# Patient Record
Sex: Male | Born: 1985 | Race: White | Hispanic: No | Marital: Single | Smoking: Current some day smoker
Health system: Southern US, Community
[De-identification: ages and names within clinical notes are randomized; demographics above are authoritative.]

## PROBLEM LIST (undated history)

## (undated) DIAGNOSIS — J45909 Unspecified asthma, uncomplicated: Secondary | ICD-10-CM

## (undated) DIAGNOSIS — I10 Essential (primary) hypertension: Secondary | ICD-10-CM

## (undated) DIAGNOSIS — T7840XA Allergy, unspecified, initial encounter: Secondary | ICD-10-CM

## (undated) HISTORY — PX: UPPER GASTROINTESTINAL ENDOSCOPY: SHX188

## (undated) HISTORY — DX: Allergy, unspecified, initial encounter: T78.40XA

---

## 2010-08-22 ENCOUNTER — Emergency Department (HOSPITAL_BASED_OUTPATIENT_CLINIC_OR_DEPARTMENT_OTHER): Admission: EM | Admit: 2010-08-22 | Discharge: 2010-08-22 | Payer: Self-pay | Admitting: Emergency Medicine

## 2011-03-10 LAB — BASIC METABOLIC PANEL
BUN: 11 mg/dL (ref 6–23)
Chloride: 103 mEq/L (ref 96–112)
Creatinine, Ser: 1.3 mg/dL (ref 0.4–1.5)

## 2013-03-23 ENCOUNTER — Encounter (HOSPITAL_BASED_OUTPATIENT_CLINIC_OR_DEPARTMENT_OTHER): Payer: Self-pay

## 2013-03-23 ENCOUNTER — Emergency Department (HOSPITAL_BASED_OUTPATIENT_CLINIC_OR_DEPARTMENT_OTHER)
Admission: EM | Admit: 2013-03-23 | Discharge: 2013-03-23 | Disposition: A | Discharge status: Home / Self Care | Payer: BC Managed Care – PPO | Attending: Emergency Medicine | Admitting: Emergency Medicine

## 2013-03-23 DIAGNOSIS — H00019 Hordeolum externum unspecified eye, unspecified eyelid: Secondary | ICD-10-CM | POA: Insufficient documentation

## 2013-03-23 DIAGNOSIS — H5710 Ocular pain, unspecified eye: Secondary | ICD-10-CM | POA: Insufficient documentation

## 2013-03-23 DIAGNOSIS — H5712 Ocular pain, left eye: Secondary | ICD-10-CM

## 2013-03-23 DIAGNOSIS — H00016 Hordeolum externum left eye, unspecified eyelid: Secondary | ICD-10-CM

## 2013-03-23 DIAGNOSIS — Z23 Encounter for immunization: Secondary | ICD-10-CM | POA: Insufficient documentation

## 2013-03-23 MED ORDER — TETANUS-DIPHTH-ACELL PERTUSSIS 5-2.5-18.5 LF-MCG/0.5 IM SUSP
0.5000 mL | Freq: Once | INTRAMUSCULAR | Status: AC
  Administered 2013-03-23: 0.5 mL via INTRAMUSCULAR

## 2013-03-23 MED ORDER — TETRACAINE HCL 0.5 % OP SOLN
1.0000 [drp] | Freq: Once | OPHTHALMIC | Status: AC
  Administered 2013-03-23: 1 [drp] via OPHTHALMIC

## 2013-03-23 MED ORDER — ERYTHROMYCIN 5 MG/GM OP OINT: TOPICAL_OINTMENT | OPHTHALMIC | Status: DC

## 2013-03-23 MED ORDER — FLUORESCEIN SODIUM 1 MG OP STRP
ORAL_STRIP | OPHTHALMIC | Status: AC
  Filled 2013-03-23: qty 1

## 2013-03-23 MED ORDER — TETRACAINE HCL 0.5 % OP SOLN
OPHTHALMIC | Status: AC
  Administered 2013-03-23: 1 [drp] via OPHTHALMIC
  Filled 2013-03-23: qty 2

## 2013-03-23 MED ORDER — FLUORESCEIN SODIUM 1 MG OP STRP
1.0000 | ORAL_STRIP | Freq: Once | OPHTHALMIC | Status: AC
  Administered 2013-03-23: 13:00:00 via OPHTHALMIC

## 2013-03-23 MED ORDER — TETANUS-DIPHTH-ACELL PERTUSSIS 5-2.5-18.5 LF-MCG/0.5 IM SUSP
INTRAMUSCULAR | Status: AC
  Administered 2013-03-23: 0.5 mL via INTRAMUSCULAR
  Filled 2013-03-23: qty 0.5

## 2013-03-23 NOTE — ED Provider Notes (Signed)
History     CSN: [id number]  Arrival date & time 03/23/13  1203   First MD Initiated Contact with Patient 03/23/13 1245      Chief Complaint  Patient presents with  . Eye Problem    (Consider location/radiation/quality/duration/timing/severity/associated sxs/prior treatment) HPI Comments: Patient is a 27 y/o M with no significant PMHx c/o left eye discomfort x 5 days - patient reported that he got Roundup in his eye on Wednesday. Patient reported that he flushed the eye. Patient reported that pain is localized to the left eye upper eyelid, described as a soreness, pressure feeling mainly to the upper lid of the left eye - mild swelling to the left upper eyelid. Patient denied eye pain, mainly left upper eyelid pain. Patient reported using Visine with little relief. Patient reported that the eye just hurts and has a small "pimple" on the upper left eyelid near the medial aspect. Patient denied tearing, inflammation, discharge, changes to vision, blurriness, pain upon motion of eyes, injection, foreign body sensation, chest pain, shortness of breathe, difficulty breathing, abdominal pain, neck pain, abdominal pain, photophobia.    Patient is a 28 y.o. male presenting with eye problem. The history is provided by the patient. No language interpreter was used.  Eye Problem Location:  L eye Quality:  Burning and dull (Patient reported a feeling of pressure, soreness to left eye) Severity:  Moderate Onset quality:  Sudden Duration:  5 days Timing:  Constant Progression:  Worsening Chronicity:  New Context: chemical exposure   Context: not burn   Context comment:  Round Up in left eye Relieved by:  Nothing Exacerbated by: applying pressure ot left eye. Ineffective treatments:  Eye drops (saline drops) Associated symptoms: no blurred vision, no crusting, no decreased vision, no discharge, no double vision, no facial rash, no itching, no nausea, no numbness, no photophobia, no redness, no  swelling, no tearing, no tingling and no vomiting   Risk factors: no conjunctival hemorrhage, not exposed to pinkeye, no previous injury to eye and no recent URI     No past medical history on file.  No past surgical history on file.  No family history on file.  History  Substance Use Topics  . Smoking status: Never Smoker   . Smokeless tobacco: Never Used  . Alcohol Use: No      Review of Systems  Constitutional: Negative for fever and chills.  HENT: Negative for ear pain, sore throat, neck pain and tinnitus.   Eyes: Positive for pain. Negative for blurred vision, double vision, photophobia, discharge, redness, itching and visual disturbance.       Left upper eyelid pain  Respiratory: Negative for cough, chest tightness and shortness of breath.   Cardiovascular: Negative for chest pain.  Gastrointestinal: Negative for nausea, vomiting and abdominal pain.  Genitourinary:       Patient denied urinary symptoms  Musculoskeletal: Negative for back pain.  Neurological: Negative for tingling and numbness.  All other systems reviewed and are negative.    Allergies  Review of patient's allergies indicates no known allergies.  Home Medications   Current Outpatient Rx  Name  Route  Sig  Dispense  Refill  . erythromycin ophthalmic ointment      Place a 1 cm ribbon of ointment into the lower eyelid of left eye QID for 5 days.   1 g   0     BP 143/86  Pulse 71  Temp(Src) 97.3 F (36.3 C) (Oral)  Resp 20  Ht  6' (1.829 m)  Wt 190 lb (86.183 kg)  BMI 25.76 kg/m2  SpO2 100%  Physical Exam  Nursing note and vitals reviewed. Constitutional: He is oriented to person, place, and time. He appears well-developed and well-nourished.  HENT:  Head: Normocephalic and atraumatic.  Eye exam: No discharge, tearing. Mild swelling to left upper eyelid. No direct photophobia, no consensual photophobia. Small 1-2 mm pustule located on medial aspect of left upper eyelid.  Pain upon  palpation to left upper eyelid. No erythema to conjunctiva or palpebral conjunctiva. EOMs intact and no pain. Everted left eyelid no foreign body noted - swabbed upper eyelid with no foreign body noted.  Slit Lamp: No corneal abrasion noted. No dendritic lesion. Woods Lamp: No corneal abrasion noted. No foreign body noted. Negative Seidel's sign.  Eyes: Conjunctivae and EOM are normal. Pupils are equal, round, and reactive to light. Right eye exhibits no discharge. Left eye exhibits no discharge.  Neck: Normal range of motion. Neck supple. No tracheal deviation present.  Cardiovascular: Normal rate, regular rhythm, normal heart sounds and intact distal pulses.  Exam reveals no friction rub.   No murmur heard. Pulmonary/Chest: Effort normal and breath sounds normal. No respiratory distress. He has no wheezes. He has no rales.  Lymphadenopathy:    He has no cervical adenopathy.  Neurological: He is alert and oriented to person, place, and time. No cranial nerve deficit. He exhibits normal muscle tone. Coordination normal.  Skin: Skin is warm and dry. No rash noted. He is not diaphoretic. No erythema.  Psychiatric: He has a normal mood and affect. His behavior is normal. Thought content normal.    ED Course  Procedures (including critical care time)  Labs Reviewed - No data to display No results found.   1. Hordeolum, left   2. Eye pain, left       MDM  DDx: Chalazion Stye  I personally evaluated and examined the patient. Tetracaine given, slit lamp performed - no sign of corneal abrasion, no foreign body noted, dendritic lesion noted. Wood's lamp performed with negative Seidel's sign, no corneal abrasion noted. No sign of uveitis or irisitis. No direct photophobia, no consensual photophobia. Patient afebrile, normotensive, non-tachycardic, alert and oriented, happy affect. Discharged patient - suspected horduleum. Discharged patient with erythromycin antibiotic ointment - discussed with  patient on how to apply medication. Discussed with patient to use warm compresses to left eye and to not rub the eye. Discussed with patient to follow-up with ophthalmologist regarding condition. Discussed with patient that if symptoms are to worsen to please report back to the ED.  Patient agreed to plan of care, understood, all questions answered.         Raymon Mutton, PA-C 03/23/13 1631 Visual acuity performed - normal. TDaP booster vaccine given.   Raymon Mutton, PA-C 03/23/13 1637

## 2013-03-23 NOTE — ED Notes (Signed)
Pt states that a few days ago he got Round Up his L eye.  Pt presents with L eyelid swelling, redness.  No visual decline.  Perrla

## 2013-03-25 NOTE — ED Provider Notes (Signed)
Medical screening examination/treatment/procedure(s) were conducted as a shared visit with non-physician practitioner(s) and myself.  I personally evaluated the patient during the encounter  Pt well appearing, no distress, no signs of acute chemical injury to eye Stable for d/c home  Joya Gaskins, MD 03/25/13 [phone]

## 2013-12-02 ENCOUNTER — Emergency Department (HOSPITAL_BASED_OUTPATIENT_CLINIC_OR_DEPARTMENT_OTHER)
Admission: EM | Admit: 2013-12-02 | Discharge: 2013-12-03 | Disposition: A | Discharge status: Home / Self Care | Payer: BC Managed Care – PPO | Attending: Emergency Medicine | Admitting: Emergency Medicine

## 2013-12-02 ENCOUNTER — Encounter (HOSPITAL_BASED_OUTPATIENT_CLINIC_OR_DEPARTMENT_OTHER): Payer: Self-pay | Admitting: Emergency Medicine

## 2013-12-02 DIAGNOSIS — I10 Essential (primary) hypertension: Secondary | ICD-10-CM | POA: Insufficient documentation

## 2013-12-02 DIAGNOSIS — Y939 Activity, unspecified: Secondary | ICD-10-CM | POA: Insufficient documentation

## 2013-12-02 DIAGNOSIS — J45909 Unspecified asthma, uncomplicated: Secondary | ICD-10-CM | POA: Insufficient documentation

## 2013-12-02 DIAGNOSIS — Y929 Unspecified place or not applicable: Secondary | ICD-10-CM | POA: Insufficient documentation

## 2013-12-02 DIAGNOSIS — IMO0002 Reserved for concepts with insufficient information to code with codable children: Secondary | ICD-10-CM | POA: Insufficient documentation

## 2013-12-02 DIAGNOSIS — T182XXA Foreign body in stomach, initial encounter: Secondary | ICD-10-CM | POA: Insufficient documentation

## 2013-12-02 DIAGNOSIS — T18108A Unspecified foreign body in esophagus causing other injury, initial encounter: Secondary | ICD-10-CM

## 2013-12-02 HISTORY — DX: Essential (primary) hypertension: I10

## 2013-12-02 HISTORY — DX: Unspecified asthma, uncomplicated: J45.909

## 2013-12-02 MED ORDER — ONDANSETRON HCL 4 MG/2ML IJ SOLN
4.0000 mg | Freq: Once | INTRAMUSCULAR | Status: AC
  Administered 2013-12-03: 4 mg via INTRAVENOUS
  Filled 2013-12-02: qty 2

## 2013-12-02 MED ORDER — GLUCAGON HCL (RDNA) 1 MG IJ SOLR
1.0000 mg | Freq: Once | INTRAMUSCULAR | Status: AC
  Administered 2013-12-03: 1 mg via INTRAVENOUS
  Filled 2013-12-02: qty 1

## 2013-12-02 MED ORDER — DIAZEPAM 5 MG/ML IJ SOLN
2.5000 mg | Freq: Once | INTRAMUSCULAR | Status: AC
  Administered 2013-12-03: 2.5 mg via INTRAVENOUS
  Filled 2013-12-02: qty 2

## 2013-12-02 NOTE — ED Provider Notes (Signed)
CSN: [id number]     Arrival date & time 12/02/13  2333 History   First MD Initiated Contact with Patient 12/02/13 2342     This chart was scribed for Rhyland Hinderliter Smitty Cords, MD by Manuela Schwartz, ED scribe. This patient was seen in room MH04/MH04 and the patient's care was started at 2342.  Chief Complaint  Patient presents with  . Foreign Body   Patient is a 27 y.o. male presenting with foreign body. The history is provided by the patient. No language interpreter was used.  Foreign Body Location:  Swallowed Suspected object:  Food Pain quality:  Dull Duration:  3 hours Timing:  Constant Progression:  Unchanged Chronicity:  Recurrent Worsened by:  Nothing tried Ineffective treatments:  Flushing with water Associated symptoms: trouble swallowing   Associated symptoms: no abdominal pain, no congestion, no cough, no difficulty breathing, no nausea, no rhinorrhea and no vomiting   Risk factors: prior similar events   Risk factors: no developmental delay and no prior surgery to area    HPI Comments: Tony Mata is a 27 y.o. male who presents to the Emergency Department complaining of foreign body sensation in his esophagus, onset 3 hours ago after eating deer meat and having it stuck, unsure of what size the piece of meat was but feels it is stuck in his esophagus. He reports previous similar episodes, he has not seen anyone for this problem. He reports spitting up secretions and unable to swallow spit. He states this problem has occurred many times for him, not more than once a month though.  Past Medical History  Diagnosis Date  . Hypertension    History reviewed. No pertinent past surgical history. History reviewed. No pertinent family history. History  Substance Use Topics  . Smoking status: Never Smoker   . Smokeless tobacco: Never Used  . Alcohol Use: No    Review of Systems  Constitutional: Negative for fever and chills.  HENT: Positive for trouble swallowing. Negative  for congestion and rhinorrhea.   Respiratory: Negative for cough and shortness of breath.   Cardiovascular: Negative for chest pain.  Gastrointestinal: Negative for nausea, vomiting, abdominal pain and diarrhea.  Musculoskeletal: Negative for back pain.  Skin: Negative for color change and rash.  Neurological: Negative for syncope.  All other systems reviewed and are negative.   A complete 10 system review of systems was obtained and all systems are negative except as noted in the HPI and PMH.   Allergies  Review of patient's allergies indicates no known allergies.  Home Medications   Current Outpatient Rx  Name  Route  Sig  Dispense  Refill  . hydrochlorothiazide (HYDRODIURIL) 25 MG tablet   Oral   Take 25 mg by mouth daily.         Marland Kitchen erythromycin ophthalmic ointment      Place a 1 cm ribbon of ointment into the lower eyelid of left eye QID for 5 days.   1 g   0    Triage Vitals: BP 132/82  Pulse 84  Temp(Src) 97.8 F (36.6 C) (Oral)  Resp 18  Ht 6' (1.829 m)  Wt 200 lb (90.719 kg)  BMI 27.12 kg/m2  SpO2 100% Physical Exam  Nursing note and vitals reviewed. Constitutional: He is oriented to person, place, and time. He appears well-developed and well-nourished. No distress.  HENT:  Head: Normocephalic and atraumatic.  Mouth/Throat: Oropharynx is clear and moist.  Nothing visible in posterior oropharynx  Eyes: Conjunctivae are normal.  Pupils are equal, round, and reactive to light. Right eye exhibits no discharge. Left eye exhibits no discharge.  Neck: Normal range of motion. No tracheal deviation present.  Cardiovascular: Normal rate, regular rhythm and normal heart sounds.   No murmur heard. Pulmonary/Chest: Effort normal and breath sounds normal. No respiratory distress. He has no wheezes. He has no rales.  Abdominal: Soft. Bowel sounds are normal. He exhibits no distension. There is no tenderness.  Musculoskeletal: Normal range of motion. He exhibits no edema.   Neurological: He is alert and oriented to person, place, and time.  Skin: Skin is warm and dry.  Psychiatric: He has a normal mood and affect. Thought content normal.    ED Course  Procedures (including critical care time) DIAGNOSTIC STUDIES: Oxygen Saturation is 100% on room air, normal by my interpretation.    COORDINATION OF CARE: At 1145 PM Discussed treatment plan with patient which includes zofran, glucagon. Patient agrees.   Labs Review Labs Reviewed - No data to display Imaging Review No results found.  EKG Interpretation   None       MDM  No diagnosis found. Have attempted glucagon follow by liquids, valium and SL NTG, still unable to tolerate secretions.    Case d/w Dr. Christella Hartigan send to [facility] ED page Dr. Christella Hartigan on arrival to decide ED vs Endo examination.   I personally performed the services described in this documentation, which was scribed in my presence. The recorded information has been reviewed and is accurate.       Jasmine Awe, MD 12/03/13 [phone]

## 2013-12-02 NOTE — ED Notes (Signed)
MD at bedside. 

## 2013-12-02 NOTE — ED Notes (Signed)
Pt c/o piece of meat stuck in throat, no diff speaking or breathing

## 2013-12-03 ENCOUNTER — Telehealth: Payer: Self-pay

## 2013-12-03 ENCOUNTER — Encounter (HOSPITAL_COMMUNITY): Payer: Self-pay | Admitting: *Deleted

## 2013-12-03 ENCOUNTER — Encounter (HOSPITAL_COMMUNITY)
Admission: EM | Disposition: A | Discharge status: Home / Self Care | Payer: Self-pay | Source: Home / Self Care | Attending: Emergency Medicine

## 2013-12-03 DIAGNOSIS — T18108A Unspecified foreign body in esophagus causing other injury, initial encounter: Secondary | ICD-10-CM

## 2013-12-03 HISTORY — PX: ESOPHAGOGASTRODUODENOSCOPY: SHX5428

## 2013-12-03 SURGERY — EGD (ESOPHAGOGASTRODUODENOSCOPY)
Anesthesia: Moderate Sedation

## 2013-12-03 MED ORDER — DIPHENHYDRAMINE HCL 50 MG/ML IJ SOLN
INTRAMUSCULAR | Status: AC
  Filled 2013-12-03: qty 1

## 2013-12-03 MED ORDER — NITROGLYCERIN 0.3 MG SL SUBL
0.2000 mg | SUBLINGUAL_TABLET | Freq: Once | SUBLINGUAL | Status: AC
  Administered 2013-12-03: 0.2 mg via SUBLINGUAL
  Filled 2013-12-03: qty 100

## 2013-12-03 MED ORDER — MIDAZOLAM HCL 10 MG/2ML IJ SOLN
INTRAMUSCULAR | Status: DC | PRN
  Administered 2013-12-03 (×4): 2.5 mg via INTRAVENOUS
  Administered 2013-12-03: 2 mg via INTRAVENOUS

## 2013-12-03 MED ORDER — MIDAZOLAM HCL 10 MG/2ML IJ SOLN
INTRAMUSCULAR | Status: AC
  Filled 2013-12-03: qty 4

## 2013-12-03 MED ORDER — FENTANYL CITRATE 0.05 MG/ML IJ SOLN
INTRAMUSCULAR | Status: DC | PRN
  Administered 2013-12-03 (×4): 25 ug via INTRAVENOUS

## 2013-12-03 MED ORDER — NITROGLYCERIN 0.4 MG SL SUBL
SUBLINGUAL_TABLET | SUBLINGUAL | Status: AC
  Filled 2013-12-03: qty 25

## 2013-12-03 MED ORDER — BUTAMBEN-TETRACAINE-BENZOCAINE 2-2-14 % EX AERO
INHALATION_SPRAY | CUTANEOUS | Status: DC | PRN
  Administered 2013-12-03: 1 via TOPICAL

## 2013-12-03 MED ORDER — FENTANYL CITRATE 0.05 MG/ML IJ SOLN
INTRAMUSCULAR | Status: AC
  Filled 2013-12-03: qty 4

## 2013-12-03 MED ORDER — DIPHENHYDRAMINE HCL 50 MG/ML IJ SOLN
INTRAMUSCULAR | Status: DC | PRN
  Administered 2013-12-03: 25 mg via INTRAVENOUS

## 2013-12-03 NOTE — ED Notes (Signed)
Bed: WA21 Expected date: 12/03/13 Expected time: 1:36 AM Means of arrival: Ambulance Comments: Transfer from med center

## 2013-12-03 NOTE — Telephone Encounter (Signed)
There was a patient chart error related to EPIC only. The endopro procedure report has all of the correct patients information. He was 27, same name otherwise. [facility] endo told me about the problem this morning and they were working to correct the EPIC portion. I didn't think to tell you about the error, I apologize. Please contact the correct Tony Mata (born [birth year] 27 years old). Also let [name] know that we tracked down the error that led to his being contacted and are correcting it. Thanks        Donata Duff, CMA at 12/03/2013 1:53 PM     Status: Signed        Dr Christella Hartigan I called this pt and he says he was never in the ER, are you sure this is the correct pt?        Donata Duff, CMA at 12/03/2013 7:59 AM     Status: Signed        Message copied by Donata Duff on Wed Dec 03, 2013 7:59 AM  ------  Message from: Rob Bunting P  Created: Wed Dec 03, 2013 5:33 AM  Was in ER early this AM with impacted deer meat in esophagus.  He needs rov with me or extender in 2-3 weeks to discuss his response to bid ppi, schedule repeat egd (5-6 weeks from now). Thanks

## 2013-12-03 NOTE — ED Notes (Signed)
Pt left with IV in place per EDP orders.  No distress noted.  Pt ambulatory.  pts family given directions to [employer].

## 2013-12-03 NOTE — Op Note (Signed)
[facility] [address] [place], [address]   ENDOSCOPY PROCEDURE REPORT  PATIENT: Tony Mata, Tony Mata  MR#: [id number] BIRTHDATE: Aug 01, 1986 , 27  yrs. old GENDER: Male ENDOSCOPIST: Rachael Fee, MD PROCEDURE DATE:  12/03/2013 PROCEDURE:  EGD w/ fb removal ASA CLASS:     Class I INDICATIONS:  foreign body in esophagus, acute dysphagia (deer meat). MEDICATIONS: Fentanyl 100 mcg IV, Benadryl 25 mg IV, and Versed 12 mg IV TOPICAL ANESTHETIC: none  DESCRIPTION OF PROCEDURE: After the risks benefits and alternatives of the procedure were thoroughly explained, informed consent was obtained.  The Pentax Gastroscope [id number] endoscope was introduced through the mouth and advanced to the stomach body. Without limitations.  The instrument was slowly withdrawn as the mucosa was fully examined.    There was a bolus of impacted fibrous dark meat in distal esophagus. The surrounding esophageal mucosa was very edematous, inflamed but there were no clear strictures.  The meat bolus was partially removed with Lucina Mellow Net and then it was able to be gently pushed into the stomach.  The examined UGI tract was otherwise normal. Retroflexed views revealed no abnormalities.     The scope was then withdrawn from the patient and the procedure completed.  COMPLICATIONS: There were no complications. ENDOSCOPIC IMPRESSION: There was a bolus of impacted fibrous dark meat in distal esophagus. The surrounding esophageal mucosa was very edematous, inflamed but there were no clear strictures.  The meat bolus was partially removed with Lucina Mellow Net and then it was able to be gently pushed into the stomach.  The examined UGI tract was otherwise normal.  RECOMMENDATIONS: You need to start taking omeprazole (OTC) 20mg  pill.  One pill twice daily (20-30 min prior to BF and dinner meals).  You need to chew your food well, eat slowly and take small bites.  My office will contact you about  office visit in 1-2 weeks to discuss repeating this endoscopy, performing biopsies to check for a condition called eosinophilic esophagitis as an outpatient.  You should continue the antiacid medicine twice daily until that next upper endoscopy.   eSigned:  Rachael Fee, MD 12/03/2013 5:28 AM

## 2013-12-03 NOTE — ED Provider Notes (Signed)
27 year old adult male presenting to [facility] after fluid bolus got stuck, while eating venison.  Has a history of same. Apparently tonight, unable to swallow or pass liquids. He was treated at [facility] with medications and no improvement of symptoms. He was accepted in transfer to the emergency department by GI Dr. Christella Hartigan who presented here to [facility] long bedside, performed bedside endoscopy and discharge patient home.  Post procedure patient is resting comfortably, feeling better.  Vital signs within normal limits.   Discharge instructions and paperwork provided to patient by Dr. Christella Hartigan  5:59 AM patient has no complaints. Speech is clear, no respiratory distress, heart rate 80s. Stable for discharge home  Sunnie Nielsen, MD 12/03/13 0600

## 2013-12-04 ENCOUNTER — Encounter (HOSPITAL_COMMUNITY): Payer: Self-pay | Admitting: Gastroenterology

## 2013-12-04 NOTE — Telephone Encounter (Signed)
Phone number listed has been disconnected and no other contact info is available.  Letter mailed

## 2013-12-12 ENCOUNTER — Encounter: Payer: Self-pay | Admitting: Gastroenterology

## 2014-01-20 ENCOUNTER — Encounter: Payer: Self-pay | Admitting: Gastroenterology

## 2014-01-20 ENCOUNTER — Ambulatory Visit (INDEPENDENT_AMBULATORY_CARE_PROVIDER_SITE_OTHER): Payer: BC Managed Care – PPO | Admitting: Gastroenterology

## 2014-01-20 VITALS — BP 118/80 | HR 78 | Ht 72.0 in | Wt 193.0 lb

## 2014-01-20 DIAGNOSIS — R1314 Dysphagia, pharyngoesophageal phase: Secondary | ICD-10-CM

## 2014-01-20 NOTE — Patient Instructions (Signed)
You should change the way you are taking your antiacid medicine (prilosec) so that you are taking it 20-30 minutes prior to a decent dinner meal as that is the way the pill is designed to work most effectively. You will be set up for an upper endoscopy in 6 weeks ([facility]) to check for eosinophilic esophagitis.  Stay on prilosec until the upper endoscopy.

## 2014-01-20 NOTE — Progress Notes (Signed)
Review of pertinent gastrointestinal problems: 1. Esophageal food impaction; 11/2013.  Deer meat.  EGD Christella HartiganJacobs with FB removal.  Recommended twice daily PPI and office visit to discuss symptoms, consider repeat EGD to biopsy for EE.   HPI: This is a  very pleasant 28 year old man whom I last saw at the time of esophageal food impaction about 6 weeks ago.  No trouble since deer meat impaction.  Chewing very well.  Was having 2 years of meat associated dysphagia.  Has pyrosis, especially if he does smokeless tobacco  Bought PPI but not really taking it regularly.  Intentionally lost 10 pounds.   Past Medical History  Diagnosis Date  . Hypertension   . Asthma     Past Surgical History  Procedure Laterality Date  . Esophagogastroduodenoscopy N/A 12/03/2013    Procedure: ESOPHAGOGASTRODUODENOSCOPY (EGD);  Surgeon: Rachael Feeaniel P Zayda Angell, MD;  [facility];  Service: Endoscopy;  Laterality: N/A;    Current Outpatient Prescriptions  Medication Sig Dispense Refill  . hydrochlorothiazide (HYDRODIURIL) 25 MG tablet Take 25 mg by mouth daily.      . montelukast (SINGULAIR) 10 MG tablet Take 10 mg by mouth at bedtime.       No current facility-administered medications for this visit.    Allergies as of 01/20/2014  . (No Known Allergies)    History reviewed. No pertinent family history.  History   Social History  . Marital Status: Single    Spouse Name: N/A    Number of Children: N/A  . Years of Education: N/A   Occupational History  . Not on file.   Social History Main Topics  . Smoking status: Never Smoker   . Smokeless tobacco: Never Used  . Alcohol Use: No     Comment: 1-2times per week  . Drug Use: No  . Sexual Activity: Not on file   Other Topics Concern  . Not on file   Social History Narrative  . No narrative on file      Physical Exam: Ht 6' (1.829 m)  Wt 193 lb (87.544 kg)  BMI 26.17 kg/m2 Constitutional: generally  well-appearing Psychiatric: alert and oriented x3 Abdomen: soft, nontender, nondistended, no obvious ascites, no peritoneal signs, normal bowel sounds     Assessment and plan: 28 y.o. male with chronic intermittent dysphagia to solids, recent esophageal food impaction  His dysphagia is either GERD, edema related or perhaps he has eosinophilic esophagitis. He has not been taking proton pump inhibitor regularly since the time of his food impaction and I recommended he do so. 1 proton pump inhibitor once daily. I will likely repeat EGD about 6 weeks time check for underlying strictures, I would proceed with biopsies for eosinophilic esophagitis as well. He knows for now to continue chew his food well and eating slowly.

## 2014-03-03 ENCOUNTER — Ambulatory Visit (AMBULATORY_SURGERY_CENTER): Payer: BC Managed Care – PPO | Admitting: Gastroenterology

## 2014-03-03 ENCOUNTER — Encounter: Payer: Self-pay | Admitting: Gastroenterology

## 2014-03-03 VITALS — BP 133/61 | HR 70 | Temp 97.0°F | Resp 23 | Ht 72.0 in | Wt 193.0 lb

## 2014-03-03 DIAGNOSIS — K200 Eosinophilic esophagitis: Secondary | ICD-10-CM

## 2014-03-03 DIAGNOSIS — R933 Abnormal findings on diagnostic imaging of other parts of digestive tract: Secondary | ICD-10-CM

## 2014-03-03 DIAGNOSIS — K222 Esophageal obstruction: Secondary | ICD-10-CM

## 2014-03-03 DIAGNOSIS — R1314 Dysphagia, pharyngoesophageal phase: Secondary | ICD-10-CM

## 2014-03-03 MED ORDER — SODIUM CHLORIDE 0.9 % IV SOLN: 500.0000 mL | INTRAVENOUS | Status: DC

## 2014-03-03 NOTE — Progress Notes (Signed)
Called to room to assist during endoscopic procedure.  Patient ID and intended procedure confirmed with present staff. Received instructions for my participation in the procedure from the performing physician.  

## 2014-03-03 NOTE — Patient Instructions (Signed)
YOU HAD AN ENDOSCOPIC PROCEDURE TODAY AT THE [facility]: Refer to the procedure report that was given to you for any specific questions about what was found during the examination.  If the procedure report does not answer your questions, please call your gastroenterologist to clarify.  If you requested that your care partner not be given the details of your procedure findings, then the procedure report has been included in a sealed envelope for you to review at your convenience later.  YOU SHOULD EXPECT: Some feelings of bloating in the abdomen. Passage of more gas than usual.  Walking can help get rid of the air that was put into your GI tract during the procedure and reduce the bloating. If you had a lower endoscopy (such as a colonoscopy or flexible sigmoidoscopy) you may notice spotting of blood in your stool or on the toilet paper. If you underwent a bowel prep for your procedure, then you may not have a normal bowel movement for a few days.  DIET: NOTHING TO EAT OR DRINK UNTIL 9:45. 9:45 UNTIL 10:45 ONLY CLEAR LIQUIDS. AFTER 10:45 ONLY SOFT FOODS. RESUME YOUR DIET IN AM.  ACTIVITY: Your care partner should take you home directly after the procedure.  You should plan to take it easy, moving slowly for the rest of the day.  You can resume normal activity the day after the procedure however you should NOT DRIVE or use heavy machinery for 24 hours (because of the sedation medicines used during the test).    SYMPTOMS TO REPORT IMMEDIATELY: A gastroenterologist can be reached at any hour.  During normal business hours, 8:30 AM to 5:00 PM Monday through Friday, call [phone].  After hours and on weekends, please call the GI answering service at [phone] who will take a message and have the physician on call contact you.   Following upper endoscopy (EGD)  Vomiting of blood or coffee ground material  New chest pain or pain under the shoulder blades  Painful or persistently  difficult swallowing  New shortness of breath  Fever of 100F or higher  Black, tarry-looking stools  FOLLOW UP: If any biopsies were taken you will be contacted by phone or by letter within the next 1-3 weeks.  Call your gastroenterologist if you have not heard about the biopsies in 3 weeks.  Our staff will call the home number listed on your records the next business day following your procedure to check on you and address any questions or concerns that you may have at that time regarding the information given to you following your procedure. This is a courtesy call and so if there is no answer at the home number and we have not heard from you through the emergency physician on call, we will assume that you have returned to your regular daily activities without incident.  SIGNATURES/CONFIDENTIALITY: You and/or your care partner have signed paperwork which will be entered into your electronic medical record.  These signatures attest to the fact that that the information above on your After Visit Summary has been reviewed and is understood.  Full responsibility of the confidentiality of this discharge information lies with you and/or your care-partner.

## 2014-03-03 NOTE — Progress Notes (Signed)
When I entered the procedure room, Judithann Saugerarlene Massey, endo tech was assisting Dr. Christella HartiganJacobs with balloon dilatation of the esophagus.  Per Dr. Christella HartiganJacobs he visually confirmed the set up and setting of the balloon by Judithann Saugerarlene Massey, endo tech.  She aided Dr. Christella HartiganJacobs with the dilation under his supervision.  maw

## 2014-03-03 NOTE — Progress Notes (Signed)
ON ADMISSION PATIENT REQUESTING TO USE PROAIR INHALER, DIS CUSSED WITH JOHN NULTY CRNA. PATIENT USING 2 PUFFS.

## 2014-03-03 NOTE — Op Note (Signed)
[facility] [address] [place], [id number]   ENDOSCOPY PROCEDURE REPORT  PATIENT: Tony Mata, Tony B.  MR#: [id number] BIRTHDATE: Aug 05, 1986 , 27  yrs. old GENDER: Male ENDOSCOPIST: Rachael Feeaniel P Reisa Coppola, MD PROCEDURE DATE:  03/03/2014 PROCEDURE:  EGD w/ biopsy , EGD with balloon dilation ASA CLASS:     Class I INDICATIONS:  Esophageal food impaction; 11/2013.  Deer meat.  EGD Christella HartiganJacobs with FB removal.  Recommended twice daily PPI; here for repeat EGD to biopsy for EE. Persistent dysphagia. MEDICATIONS: MAC sedation, administered by CRNA and propofol (Diprivan) 300mg  IV TOPICAL ANESTHETIC: none  DESCRIPTION OF PROCEDURE: After the risks benefits and alternatives of the procedure were thoroughly explained, informed consent was obtained.  The LB [id number] endoscope was introduced through the mouth and advanced to the second portion of the duodenum. Without limitations.  The instrument was slowly withdrawn as the mucosa was fully examined.   There were linear furrows throughout the esophagus.  The GE junction was granular, and somewhat stenotic (lumen 14mm). This was not typical appearing for peptic stricture or Schatzki's ring.  Was not neoplastic appearing.  The GE junction was dilated with CRE TTS ballon, dilated up to 16.295mm.  There was a 1.5cm long, linear, superficial tear at the GE junction and self limited oozing following dilation.  The distal and proximal esophagus were then dilated and put into separate jars.  The examination was otherwise normal.  Retroflexed views revealed no abnormalities.     The scope was then withdrawn from the patient and the procedure completed. COMPLICATIONS: There were no complications.  ENDOSCOPIC IMPRESSION: See above. Findings were suspicious for Eosinophilic Esophagitis.  RECOMMENDATIONS: Await pathology results. Continue once daily prilosec. Liquid diet for 24 hours.   eSigned:  Rachael Feeaniel P Jaymin Waln, MD 03/03/2014  8:51 AM

## 2014-03-03 NOTE — Progress Notes (Signed)
A/ox3 pleased with MAC, report to Karen RN 

## 2014-03-04 ENCOUNTER — Telehealth: Payer: Self-pay

## 2014-03-04 NOTE — Telephone Encounter (Signed)
  Follow up Call-  Call back number 03/03/2014  Post procedure Call Back phone  # [phone]  Permission to leave phone message Yes     Patient questions:  Do you have a fever, pain , or abdominal swelling? no Pain Score  0 *  Have you tolerated food without any problems? yes  Have you been able to return to your normal activities? yes  Do you have any questions about your discharge instructions: Diet   no Medications  no Follow up visit  no  Do you have questions or concerns about your Care? no  Actions: * If pain score is 4 or above: No action needed, pain <4.

## 2014-03-11 ENCOUNTER — Other Ambulatory Visit: Payer: Self-pay

## 2014-03-11 MED ORDER — FLUTICASONE PROPIONATE HFA 220 MCG/ACT IN AERO: INHALATION_SPRAY | RESPIRATORY_TRACT | Status: AC

## 2014-03-17 ENCOUNTER — Telehealth: Payer: Self-pay | Admitting: Gastroenterology

## 2014-03-17 NOTE — Telephone Encounter (Signed)
The pt states he has no swallowing problems at this time, follow up appt has been made and he will call if his symptoms return

## 2014-03-17 NOTE — Telephone Encounter (Signed)
fluticosone is Nasal spray only is this oK?

## 2014-03-17 NOTE — Telephone Encounter (Signed)
Please d/c the medicine.  Please call Tammy SoursGreg and let himi know about the difficulty.  Other option is oral steroids (would be low dose, prednisone 5mg  daily).  I think before going ahead with that, Id like to see him in office for rov to discuss his response to the dilation, if very good response then would hold on new meds for now (would be difficult to judge if the meds help his swallowing if his swallowing is already so much better from just dilation).

## 2014-03-17 NOTE — Telephone Encounter (Signed)
Ok, thanks.

## 2014-05-12 ENCOUNTER — Ambulatory Visit: Payer: BC Managed Care – PPO | Admitting: Gastroenterology

## 2015-01-04 ENCOUNTER — Ambulatory Visit (INDEPENDENT_AMBULATORY_CARE_PROVIDER_SITE_OTHER): Payer: BLUE CROSS/BLUE SHIELD | Admitting: Family Medicine

## 2015-01-04 VITALS — BP 132/84 | HR 81 | Temp 98.2°F | Resp 16 | Ht 70.0 in | Wt 180.2 lb

## 2015-01-04 DIAGNOSIS — L02511 Cutaneous abscess of right hand: Secondary | ICD-10-CM

## 2015-01-04 MED ORDER — MUPIROCIN 2 % EX OINT: 1.0000 "application " | TOPICAL_OINTMENT | Freq: Three times a day (TID) | CUTANEOUS | Status: DC

## 2015-01-04 MED ORDER — DOXYCYCLINE HYCLATE 100 MG PO CAPS: 100.0000 mg | ORAL_CAPSULE | Freq: Two times a day (BID) | ORAL | Status: DC

## 2015-01-04 NOTE — Patient Instructions (Signed)
Abscess °Care After °An abscess (also called a boil or furuncle) is an infected area that contains a collection of pus. Signs and symptoms of an abscess include pain, tenderness, redness, or hardness, or you may feel a moveable soft area under your skin. An abscess can occur anywhere in the body. The infection may spread to surrounding tissues causing cellulitis. A cut (incision) by the surgeon was made over your abscess and the pus was drained out. Gauze may have been packed into the space to provide a drain that will allow the cavity to heal from the inside outwards. The boil may be painful for 5 to 7 days. Most people with a boil do not have high fevers. Your abscess, if seen early, may not have localized, and may not have been lanced. If not, another appointment may be required for this if it does not get better on its own or with medications. °HOME CARE INSTRUCTIONS  °· Only take over-the-counter or prescription medicines for pain, discomfort, or fever as directed by your caregiver. °· When you bathe, soak and then remove gauze or iodoform packs at least daily or as directed by your caregiver. You may then wash the wound gently with mild soapy water. Repack with gauze or do as your caregiver directs. °SEEK IMMEDIATE MEDICAL CARE IF:  °· You develop increased pain, swelling, redness, drainage, or bleeding in the wound site. °· You develop signs of generalized infection including muscle aches, chills, fever, or a general ill feeling. °· An oral temperature above 102° F (38.9° C) develops, not controlled by medication. °See your caregiver for a recheck if you develop any of the symptoms described above. If medications (antibiotics) were prescribed, take them as directed. °Document Released: 06/29/2005 Document Revised: 03/04/2012 Document Reviewed: 02/24/2008 °ExitCare® Patient Information ©2015 [employer]. This information is not intended to replace advice given to you by your health care provider. Make sure  you discuss any questions you have with your health care provider. ° °Abscess °An abscess is an infected area that contains a collection of pus and debris. It can occur in almost any part of the body. An abscess is also known as a furuncle or boil. °CAUSES  °An abscess occurs when tissue gets infected. This can occur from blockage of oil or sweat glands, infection of hair follicles, or a minor injury to the skin. As the body tries to fight the infection, pus collects in the area and creates pressure under the skin. This pressure causes pain. People with weakened immune systems have difficulty fighting infections and get certain abscesses more often.  °SYMPTOMS °Usually an abscess develops on the skin and becomes a painful mass that is red, warm, and tender. If the abscess forms under the skin, you may feel a moveable soft area under the skin. Some abscesses break open (rupture) on their own, but most will continue to get worse without care. The infection can spread deeper into the body and eventually into the bloodstream, causing you to feel ill.  °DIAGNOSIS  °Your caregiver will take your medical history and perform a physical exam. A sample of fluid may also be taken from the abscess to determine what is causing your infection. °TREATMENT  °Your caregiver may prescribe antibiotic medicines to fight the infection. However, taking antibiotics alone usually does not cure an abscess. Your caregiver may need to make a small cut (incision) in the abscess to drain the pus. In some cases, gauze is packed into the abscess to reduce pain and to   continue draining the area. °HOME CARE INSTRUCTIONS  °· Only take over-the-counter or prescription medicines for pain, discomfort, or fever as directed by your caregiver. °· If you were prescribed antibiotics, take them as directed. Finish them even if you start to feel better. °· If gauze is used, follow your caregiver's directions for changing the gauze. °· To avoid spreading the  infection: °¨ Keep your draining abscess covered with a bandage. °¨ Wash your hands well. °¨ Do not share personal care items, towels, or whirlpools with others. °¨ Avoid skin contact with others. °· Keep your skin and clothes clean around the abscess. °· Keep all follow-up appointments as directed by your caregiver. °SEEK MEDICAL CARE IF:  °· You have increased pain, swelling, redness, fluid drainage, or bleeding. °· You have muscle aches, chills, or a general ill feeling. °· You have a fever. °MAKE SURE YOU:  °· Understand these instructions. °· Will watch your condition. °· Will get help right away if you are not doing well or get worse. °Document Released: 09/20/2005 Document Revised: 06/11/2012 Document Reviewed: 02/23/2012 °ExitCare® Patient Information ©2015 [employer]. This information is not intended to replace advice given to you by your health care provider. Make sure you discuss any questions you have with your health care provider. ° °

## 2015-01-04 NOTE — Progress Notes (Signed)
Procedure Consent obtained. Local anesthesia 1cc 1% lido. Cleaned with iodine. 1 cm incision made. Purulence expressed. Wound culture obtained. Wound explored and packed with 1/4 plain packing. Clean dressing placed. Care instructions given.

## 2015-01-04 NOTE — Progress Notes (Signed)
Subjective:  This chart was scribed for Norberto SorensonEva Lourdes Kucharski, MD by Haywood PaoNadim Abu Hashem, ED Scribe at [facility].The patient was seen in exam room 10 and the patient's care was started at 8:48 PM.   Patient ID: Tony JesterGregory B Drew, male    DOB: 06/24/1986, 29 y.o.   MRN: [id number] Chief Complaint  Patient presents with  . Abscess    RIght Hand   HPI  HPI Comments: Tony Mata is a 29 y.o. male who [facility] complaining of an abscess on the dorsal aspect of the 5th right MTP, onset 4 days ago. It started the size of a pimple and has gradually grown since onset. Pt reports squeezing the abscess. It has drained. Pt popped it yesterday and the day before. He has used Vaseline and put bandage on it for relief. He also used alcohol for relief. Pt does have a Hx of abscess. He has had one on his knee 8 months ago and one on his ankle 2 years ago. Pt does physical labor for work.   Past Medical History  Diagnosis Date  . Hypertension   . Asthma   . Allergy   No Known Allergies Current Outpatient Prescriptions on File Prior to Visit  Medication Sig Dispense Refill  . fluticasone (FLOVENT HFA) 220 MCG/ACT inhaler Please swallow 2 sprays twice a day. Rinse mouth with water and do not swallow the water. Do not eat or drink for 30 min after swallowing the spray. 1 Inhaler 6  . hydrochlorothiazide (HYDRODIURIL) 25 MG tablet Take 25 mg by mouth daily.    . montelukast (SINGULAIR) 10 MG tablet Take 10 mg by mouth at bedtime.     No current facility-administered medications on file prior to visit.   Review of Systems  Skin: Positive for color change.       Positive for abscess.       Objective:  BP 132/84 mmHg  Pulse 81  Temp(Src) 98.2 F (36.8 C) (Oral)  Resp 16  Ht 5\' 10"  (1.778 m)  Wt 180 lb 4 oz (81.761 kg)  BMI 25.86 kg/m2  SpO2 97%  Physical Exam  Constitutional: He is oriented to person, place, and time. He appears well-developed and well-nourished. No distress.    HENT:  Head: Normocephalic and atraumatic.  Eyes: Pupils are equal, round, and reactive to light.  Neck: Normal range of motion.  Cardiovascular: Normal rate and regular rhythm.   Pulmonary/Chest: Effort normal. No respiratory distress.  Musculoskeletal: Normal range of motion.  Neurological: He is alert and oriented to person, place, and time.  Skin: Skin is warm and dry.  1 inch diameter of fluctuant, warm, erythematous tender nodule on the dorsal aspect 5 right MTP. With central ulceration with purulent sanguineous drainage. Surrounding induration extending palmar aspect up through 5 MTP and down into wrist.   Psychiatric: He has a normal mood and affect. His behavior is normal.  Nursing note and vitals reviewed.     Assessment & Plan:   Abscess of hand, right - Plan: Wound culture S/p I&D by tishira PA Pt has had several abscesses recently  - reviewed that he may benefit from 5d of hibiclens body wash and nasal bactroban.  If he notices he is forming another if he can treat it several times daily with warm wet heat followed by topical bactroban he may be able to prevent need for I&D.  RTC in 2d for wound check. Meds ordered this encounter  Medications  .  doxycycline (VIBRAMYCIN) 100 MG capsule    Sig: Take 1 capsule (100 mg total) by mouth 2 (two) times daily.    Dispense:  20 capsule    Refill:  0  . mupirocin ointment (BACTROBAN) 2 %    Sig: Apply 1 application topically 3 (three) times daily.    Dispense:  30 g    Refill:  1    I personally performed the services described in this documentation, which was scribed in my presence. The recorded information has been reviewed and considered, and addended by me as needed.  Norberto Sorenson, MD MPH

## 2015-01-06 ENCOUNTER — Ambulatory Visit (INDEPENDENT_AMBULATORY_CARE_PROVIDER_SITE_OTHER): Payer: BLUE CROSS/BLUE SHIELD | Admitting: Physician Assistant

## 2015-01-06 VITALS — BP 110/72 | HR 79 | Temp 98.1°F | Resp 18 | Ht 70.0 in | Wt 180.0 lb

## 2015-01-06 DIAGNOSIS — L02511 Cutaneous abscess of right hand: Secondary | ICD-10-CM

## 2015-01-06 NOTE — Progress Notes (Signed)
   Subjective:    Patient ID: Tony Mata, male    DOB: 06/24/1986, 29 y.o.   MRN: [id number]  HPI Patient presents presents status post I&D of abscess on right hand 2 days ago. Has been well in the interim. Has changed dressing and put and OTC antibiotic on wound yesterday. Has continued to work as Administratorlandscaper. Denies fever, chills, or pain. Just started antibiotic yesterday and is tolerating it.    Review of Systems  Constitutional: Negative for fever and chills.  Skin: Positive for wound.       Objective:   Physical Exam  Constitutional: He appears well-developed and well-nourished. No distress.  Blood pressure 110/72, pulse 79, temperature 98.1 F (36.7 C), temperature source Oral, resp. rate 18, height 5\' 10"  (1.778 m), weight 180 lb (81.647 kg), SpO2 98 %.  HENT:  Head: Normocephalic and atraumatic.  Right Ear: External ear normal.  Left Ear: External ear normal.  Skin: Skin is warm and dry. No rash noted. He is not diaphoretic. No erythema. No pallor.  Wound: Necrotic tissue present. Mild erythema surrounding wound.    Procedure Consent obtained. Dressing and packing removed. Wound irrigated. Necrotic tissue removed. No purulence expressed. 1/4 plain packing placed. Clean dressing placed.      Assessment & Plan:  1. Abscess of hand, right Wound healing ok. Advised to decrease use of hand so that wound is not continued to be irritated. Continue antibiotic. Care instructions given again. No topical ointments needed. RTC 01/08/15 for wound care.   Janan Ridgeishira Jakory Matsuo PA-C  [facility] 01/06/2015 2:10 PM

## 2015-01-08 ENCOUNTER — Ambulatory Visit (INDEPENDENT_AMBULATORY_CARE_PROVIDER_SITE_OTHER): Payer: BLUE CROSS/BLUE SHIELD | Admitting: Physician Assistant

## 2015-01-08 VITALS — BP 126/80 | HR 85 | Temp 98.2°F | Resp 16

## 2015-01-08 DIAGNOSIS — Z5189 Encounter for other specified aftercare: Secondary | ICD-10-CM

## 2015-01-08 LAB — WOUND CULTURE: Gram Stain: NONE SEEN

## 2015-01-08 NOTE — Patient Instructions (Signed)
Your wound is healing well.  We did not repack the wound today.  You do not need to come back to clinic unless you notice any pus coming out of the wound, any redness expanding around it, or any increased pain in the area. If you have any fevers or chills please return to clinic.  Please continue to be cautious with the hand for the next couple days.  Please continue to do daily dressing changes for the next few days.

## 2015-01-08 NOTE — Progress Notes (Signed)
   Subjective:    Patient ID: Tony Mata, male    DOB: 06/24/1986, 29 y.o.   MRN: [id number]  HPI  [age] yom returns to clinic for wound care.   Initially seen 29/11/16 with abscess right hand. I&D done and wound packed. Was seen for return 01/06/15, mild erythema was still surrounding wound. Necrotic tissue was removed. No purulence. Repacked.  Today he returns for wound care. Has been doing well. Not painful or tender for him anymore. Continues to take doxy bid. He has not used his hand much yest and doesn't plan to today. Denies fever, chills. Has applied warm compress and has done a dressing change.   Review of Systems No fever, chills.     Objective:   Physical Exam  Constitutional: He appears well-developed and well-nourished.  Non-toxic appearance. He does not have a sickly appearance. He does not appear ill. No distress.  BP 126/80 mmHg  Pulse 85  Temp(Src) 98.2 F (36.8 C) (Oral)  Resp 16  SpO2 98%   Skin:  Wound healing well with granulation tissue. Minimal necrotic tissue around wound edge, removed. No purulence, only blood expressed. Induration approx 1cm surrounding wound. Packing removed without much material.       Assessment & Plan:   29 yom returns to clinic for wound care.   Encounter for wound care --Healing well with granulation tissue, no purulence, no erythema, induration decreased --No packing placed today, wound re-dressed --No need for rtc unless pain, fever, chills, purulence, erythema --continue doxy  Tony Lopesodd M. Shannon Balthazar, PA-C Physician Assistant-Certified [facility]  01/08/2015 3:37 PM

## 2015-02-19 ENCOUNTER — Other Ambulatory Visit: Payer: Self-pay | Admitting: Physician Assistant

## 2015-02-19 ENCOUNTER — Ambulatory Visit (INDEPENDENT_AMBULATORY_CARE_PROVIDER_SITE_OTHER): Payer: BLUE CROSS/BLUE SHIELD

## 2015-02-19 ENCOUNTER — Ambulatory Visit (INDEPENDENT_AMBULATORY_CARE_PROVIDER_SITE_OTHER): Payer: BLUE CROSS/BLUE SHIELD | Admitting: Physician Assistant

## 2015-02-19 VITALS — BP 144/94 | HR 96 | Temp 98.4°F | Resp 16 | Ht 70.0 in | Wt 180.0 lb

## 2015-02-19 DIAGNOSIS — S025XXB Fracture of tooth (traumatic), initial encounter for open fracture: Secondary | ICD-10-CM

## 2015-02-19 DIAGNOSIS — M25531 Pain in right wrist: Secondary | ICD-10-CM

## 2015-02-19 DIAGNOSIS — S62309A Unspecified fracture of unspecified metacarpal bone, initial encounter for closed fracture: Secondary | ICD-10-CM

## 2015-02-19 DIAGNOSIS — S62339A Displaced fracture of neck of unspecified metacarpal bone, initial encounter for closed fracture: Secondary | ICD-10-CM | POA: Insufficient documentation

## 2015-02-19 DIAGNOSIS — M79641 Pain in right hand: Secondary | ICD-10-CM

## 2015-02-19 MED ORDER — HYDROCODONE-ACETAMINOPHEN 5-325 MG PO TABS: 1.0000 | ORAL_TABLET | Freq: Four times a day (QID) | ORAL | Status: DC | PRN

## 2015-02-19 NOTE — Progress Notes (Signed)
Subjective:    Patient ID: Tony Mata, male    DOB: 06/24/1986, 29 y.o.   MRN: [id number]  Chief Complaint  Patient presents with  . Wrist Pain    Right x this am  hitting sheet rock  . Dental Pain    top front tooth fell while hitting sheet rock    Patient Active Problem List   Diagnosis Date Noted  . Dysphagia, pharyngoesophageal phase 01/20/2014   Prior to Admission medications   Medication Sig Start Date End Date Taking? Authorizing Provider  fluticasone (FLOVENT HFA) 220 MCG/ACT inhaler Please swallow 2 sprays twice a day. Rinse mouth with water and do not swallow the water. Do not eat or drink for 30 min after swallowing the spray. 03/11/14  Yes Rachael Feeaniel P Jacobs, MD  montelukast (SINGULAIR) 10 MG tablet Take 10 mg by mouth at bedtime.   Yes Historical Provider, MD  PROAIR HFA 108 (90 BASE) MCG/ACT inhaler  02/04/15  Yes Historical Provider, MD   Medications, allergies, past medical history, surgical history, family history, social history and problem list reviewed and updated.  HPI  [age] yom with no pertinent pmh presents after punching wall.   Drinking alcohol last night. Punched wall several times and oak door. Fell on concrete and hit tooth. Chipped off front right upper central incisor. Denies loc. Denies hitting head.   Denies cp, sob, fever, chills.   Review of Systems See HPI.     Objective:   Physical Exam  Constitutional: He is oriented to person, place, and time. He appears well-developed and well-nourished.  Non-toxic appearance. He does not have a sickly appearance. He does not appear ill. No distress.  BP 144/94 mmHg  Pulse 96  Temp(Src) 98.4 F (36.9 C) (Oral)  Resp 16  Ht 5\' 10"  (1.778 m)  Wt 180 lb (81.647 kg)  BMI 25.83 kg/m2  SpO2 100%  PF 100 L/min   HENT:  Mouth/Throat:    Right upper central incisor chipped. Old crown in place was also chipped. Tooth stable in place. No abrasion on lip. No upper palate injury. No ttp over mandible. No  bruising. Normal TMJ movement.   Musculoskeletal:       Right wrist: He exhibits decreased range of motion, tenderness, bony tenderness and swelling.       Right hand: He exhibits decreased range of motion, tenderness, bony tenderness and swelling. He exhibits normal capillary refill. Normal sensation noted. Decreased strength noted.       Hands: Diffuse swelling over right wrist, hand. Small abrasion at right 5th digit mcp joint. Limited rom wrist and hand due to pain. Bruising over hand. Normal cap refill. Normal sensation. Decreased strength testing due to pain.   Neurological: He is alert and oriented to person, place, and time.  Psychiatric: He has a normal mood and affect. His speech is normal.   [facility] reading (PRIMARY) by  Dr. Dareen PianoAnderson . Findings: Suggestion of fracture.      Assessment & Plan:   [age] yom with no pertinent pmh presents after punching wall.   Right hand pain - Plan: DG Hand Complete Right Right wrist pain - Plan: DG Wrist Complete Right --fx base 5th metacarpal --ulnar gutter splint placed --ortho referral placed --> ideally they will see Mon 02/22/15 for f/u --norco prn --ice/elevation  Chipped tooth, open, initial encounter --no acute bleeding, instability --pt planning to f/u with his dentist mon --able to eat ok  Donnajean Lopesodd M. Alphons Burgert, PA-C Physician Assistant-Certified [facility]  02/19/2015 6:37 PM

## 2015-02-19 NOTE — Patient Instructions (Addendum)
You fractured the base of your 5th finger.  We referred you to see ortho. [place] or them will be in contact with you soon to schedule the appt. Ideally you'll see them Monday. Please see your dentist Monday as we talked about.  Please take the norco up to every 6 hours as needed for pain.   Hand Fracture, Fifth Metacarpal The small metacarpal is the bone at the base of the little finger between the knuckle and the wrist. A fracture is a break in that bone. One of the fractures that is common to this bone is called a Boxer's Fracture. TREATMENT These fractures can be treated with:   Reduction (bones moved back into place), then pinned through the skin to maintain the position, and then casted for about 6 weeks or as your caregiver determines necessary.  ORIF (open reduction and internal fixation) - the fracture site is opened and the bone pieces are fixed into place with pins and then casted for approximately 6 weeks or as your caregiver determines necessary. Your caregiver will discuss the type of fracture you have and the treatment that should be best for that problem. If surgery is the treatment of choice, the following is information for you to know, and also let your caregiver know about prior to surgery.  LET YOUR CAREGIVER KNOW ABOUT:  Allergies.  Medications taken including herbs, eye drops, over the counter medications, and creams.  Use of steroids (by mouth or creams).  Previous problems with anesthetics or novocaine.  Possibility of pregnancy, if this applies.  History of blood clots (thrombophlebitis).  History of bleeding or blood problems.  Previous surgery.  Other health problems. AFTER THE PROCEDURE After surgery, you will be taken to the recovery area where a nurse will watch and check your progress. Once you're awake, stable, and taking fluids well, barring other problems you'll be allowed to go home. Once home an ice pack applied to your operative site may help with  discomfort and keep the swelling down. HOME CARE INSTRUCTIONS   Follow your caregiver's instructions as to activities, exercises, physical therapy, and driving a car.  Daily exercise is helpful for maintaining range of motion (movement and mobility) and strength. Exercise as instructed.  To lessen swelling, keep the injured hand elevated above the level of your heart as much as possible.  Apply ice to the injury for 15-20 minutes each hour while awake for the first 2 days. Put the ice in a plastic bag and place a thin towel between the bag of ice and your cast.  Move the fingers of your casted hand at least several times a day.  If a plaster or fiberglass cast was applied:  Do not try to scratch the skin under the cast using a sharp or pointed object.  Check the skin around the cast every day. You may put lotion on red or sore areas.  Keep your cast dry. Your cast can be protected during bathing with a plastic bag. Do not put your cast into the water.  If a plaster splint was applied:  Wear the splint for as long as directed by your caregiver or until seen for follow-up examination.  Do not get your splint wet. Protect it during bathing with a plastic bag.  You may loosen the elastic bandage around the splint if your fingers start to get numb, tingle, get cold or turn blue.  Do not put pressure on your cast or splint; this may cause it to break.  Especially, do not lean plaster casts on hard surfaces for 24 hours after application.  Take medications as directed by your caregiver.  Only take over-the-counter or prescription medicines for pain, discomfort, or fever as directed by your caregiver.  Follow all instructions for physician referrals, physical therapy, and rehabilitation. Any delay in obtaining necessary care could result in permanent injury, disability and chronic pain. SEEK MEDICAL CARE IF:   Increased bleeding (more than a small spot) from the wound or from beneath  your cast or splint if there is a wound beneath the cast from surgery.  Redness, swelling, or increasing pain in the wound or from beneath your cast or splint.  Pus coming from wound or from beneath your cast or splint.  An unexplained oral temperature above 102 F (38.9 C) develops.  A foul smell coming from the wound or dressing or from beneath your cast or splint.  You are unable to move your little finger. SEEK IMMEDIATE MEDICAL CARE IF:  You develop a rash, have difficulty breathing, or have any allergy problems. If you do not have a window in your cast for observing the wound, a discharge or minor bleeding may show up as a stain on the outside of your cast. Report these findings to your caregiver. MAKE SURE YOU:   Understand these instructions.  Will watch your condition.  Will get help right away if you are not doing well or get worse. Document Released: 03/19/2001 Document Revised: 03/04/2012 Document Reviewed: 07/30/2008 [facility] Patient Information 2015 [place], [place]. This information is not intended to replace advice given to you by your health care provider. Make sure you discuss any questions you have with your health care provider.

## 2015-02-22 NOTE — Progress Notes (Signed)
  Medical screening examination/treatment/procedure(s) were performed by non-physician practitioner and as supervising physician I was immediately available for consultation/collaboration.     

## 2017-04-01 ENCOUNTER — Emergency Department (HOSPITAL_BASED_OUTPATIENT_CLINIC_OR_DEPARTMENT_OTHER)
Admission: EM | Admit: 2017-04-01 | Discharge: 2017-04-01 | Disposition: A | Discharge status: Home / Self Care | Payer: BLUE CROSS/BLUE SHIELD | Attending: Emergency Medicine | Admitting: Emergency Medicine

## 2017-04-01 ENCOUNTER — Encounter (HOSPITAL_BASED_OUTPATIENT_CLINIC_OR_DEPARTMENT_OTHER): Payer: Self-pay | Admitting: Emergency Medicine

## 2017-04-01 DIAGNOSIS — L03011 Cellulitis of right finger: Secondary | ICD-10-CM | POA: Diagnosis not present

## 2017-04-01 DIAGNOSIS — J45909 Unspecified asthma, uncomplicated: Secondary | ICD-10-CM | POA: Diagnosis not present

## 2017-04-01 DIAGNOSIS — Z79899 Other long term (current) drug therapy: Secondary | ICD-10-CM | POA: Diagnosis not present

## 2017-04-01 DIAGNOSIS — I10 Essential (primary) hypertension: Secondary | ICD-10-CM | POA: Insufficient documentation

## 2017-04-01 DIAGNOSIS — S6991XA Unspecified injury of right wrist, hand and finger(s), initial encounter: Secondary | ICD-10-CM | POA: Diagnosis present

## 2017-04-01 DIAGNOSIS — Y929 Unspecified place or not applicable: Secondary | ICD-10-CM | POA: Diagnosis not present

## 2017-04-01 DIAGNOSIS — F17200 Nicotine dependence, unspecified, uncomplicated: Secondary | ICD-10-CM | POA: Insufficient documentation

## 2017-04-01 DIAGNOSIS — Y9389 Activity, other specified: Secondary | ICD-10-CM | POA: Diagnosis not present

## 2017-04-01 DIAGNOSIS — Y998 Other external cause status: Secondary | ICD-10-CM | POA: Insufficient documentation

## 2017-04-01 DIAGNOSIS — L03019 Cellulitis of unspecified finger: Secondary | ICD-10-CM

## 2017-04-01 DIAGNOSIS — W228XXA Striking against or struck by other objects, initial encounter: Secondary | ICD-10-CM | POA: Diagnosis not present

## 2017-04-01 MED ORDER — LIDOCAINE HCL (PF) 1 % IJ SOLN
5.0000 mL | Freq: Once | INTRAMUSCULAR | Status: AC
  Administered 2017-04-01: 5 mL
  Filled 2017-04-01: qty 5

## 2017-04-01 NOTE — ED Triage Notes (Signed)
Friday hit a bush with his right fist , while drinking. Was seen in [facility] ED yesterday and had xray and placed on clindamycin . States feels like a piece of wood is struck in finger and is red and swollen, with drainage noted.

## 2017-04-01 NOTE — ED Provider Notes (Signed)
[facility]   CSN: [id number] Arrival date & time: 04/01/17  0941     History   Chief Complaint Chief Complaint  Patient presents with  . Finger Injury    HPI Tony Mata is a 31 y.o. male.  HPI  31 y.o. male with a hx of HTN, presents to the Emergency Department today complaining of right 5th digit injury on Friday. Pt states that he was working with an Scientific laboratory technician bush and suffered multiple abrasions on hand. Notes worse on 5th digit on dorsal aspect. States that he was seen at [facility] and given Clindamycin, which he began taking yesterday as well as soaking injured area. Pt return today, because he feels that he has an object stuck in his finger. He was told xrays of that area showed no fractures or foreign bodies. He has developed no fevers. No erythema. No purulence or worsening of symptoms. Pt is following up with ortho hand surgery next week. No other symptoms noted.   Past Medical History:  Diagnosis Date  . Allergy   . Asthma   . Hypertension     Patient Active Problem List   Diagnosis Date Noted  . Boxer's fracture 02/19/2015  . Dysphagia, pharyngoesophageal phase 01/20/2014    Past Surgical History:  Procedure Laterality Date  . ESOPHAGOGASTRODUODENOSCOPY N/A 12/03/2013   Procedure: ESOPHAGOGASTRODUODENOSCOPY (EGD);  Surgeon: Rachael Fee, MD;  Location: [facility];  Service: Endoscopy;  Laterality: N/A;  . UPPER GASTROINTESTINAL ENDOSCOPY         Home Medications    Prior to Admission medications   Medication Sig Start Date End Date Taking? Authorizing Provider  fluticasone (FLOVENT HFA) 220 MCG/ACT inhaler Please swallow 2 sprays twice a day. Rinse mouth with water and do not swallow the water. Do not eat or drink for 30 min after swallowing the spray. 03/11/14   Rachael Fee, MD  HYDROcodone-acetaminophen (NORCO) 5-325 MG per tablet Take 1 tablet by mouth every 6 (six) hours as needed. 02/19/15   Todd McVeigh, PA    montelukast (SINGULAIR) 10 MG tablet Take 10 mg by mouth at bedtime.    Historical Provider, MD  PROAIR HFA 108 (90 BASE) MCG/ACT inhaler  02/04/15   Historical Provider, MD    Family History Family History  Problem Relation Age of Onset  . Heart disease Father     Social History Social History  Substance Use Topics  . Smoking status: Current Every Day Smoker  . Smokeless tobacco: Never Used  . Alcohol use 0.0 oz/week     Comment: 1-2times per week     Allergies   Patient has no known allergies.   Review of Systems Review of Systems  Constitutional: Negative for fever.  Musculoskeletal: Positive for arthralgias.  Skin: Positive for wound.   Physical Exam Updated Vital Signs BP (!) 145/102 (BP Location: Left Arm)   Pulse 82   Temp 98.2 F (36.8 C) (Oral)   Resp 16   Ht  (1.803 m)   Wt 88 kg   SpO2 100%   BMI 27.06 kg/m   Physical Exam  Constitutional: He is oriented to person, place, and time. Vital signs are normal. He appears well-developed and well-nourished.  HENT:  Head: Normocephalic and atraumatic.  Right Ear: Hearing normal.  Left Ear: Hearing normal.  Eyes: Conjunctivae and EOM are normal. Pupils are equal, round, and reactive to light.  Cardiovascular: Normal rate and regular rhythm.   Pulmonary/Chest: Effort normal.  Musculoskeletal:  Right 5th digit on dorsal aspect with abrasion noted. Superficial cellulitis with mild erythema. No purulence. ROM intact. No obvious swelling. No red streaking.   Neurological: He is alert and oriented to person, place, and time.  Skin: Skin is warm and dry.  Psychiatric: He has a normal mood and affect. His speech is normal and behavior is normal. Thought content normal.  Nursing note and vitals reviewed.  ED Treatments / Results  Labs (all labs ordered are listed, but only abnormal results are displayed) Labs Reviewed - No data to display  EKG  EKG Interpretation None       Radiology No results  found.  Procedures Procedures (including critical care time)  Medications Ordered in ED Medications - No data to display   Initial Impression / Assessment and Plan / ED Course  I have reviewed the triage vital signs and the nursing notes.  Pertinent labs & imaging results that were available during my care of the patient were reviewed by me and considered in my medical decision making (see chart for details).  Final Clinical Impressions(s) / ED Diagnoses   {I have reviewed and evaluated the relevant imaging studies.  {I have reviewed the relevant previous healthcare records.  {I obtained HPI from historian.   ED Course:  Assessment: Pt is a 31 y.o. male who presents with right dorsal 5th digit abrasion with superficial cellulitis. Seen at [facility] for same. Given Clinda that he started yesterday. Notes no worsening symptoms. Concern for possible splinter in hand. Imaging at [facility] showed no fracture or foriegn body. On exam, pt in NAD. Nontoxic/nonseptic appearing. VSS. Afebrile. 5th digit without erythema. No swelling. No purulence or red streaking. Noted FB on exam of wound. Appears black and able to palpate with forceps. Attempted to remove without success. Cleaned wound and wrapped. Encouraged to continue ABX as early in course. Wound care discussed. Plan is to DC home with follow up to Hand surgeon that was originally scheduled for patient for FB removal. At time of discharge, Patient is in no acute distress. Vital Signs are stable. Patient is able to ambulate. Patient able to tolerate PO.   Disposition/Plan:  DC Home Additional Verbal discharge instructions given and discussed with patient.  Pt Instructed to f/u with PCP in the next week for evaluation and treatment of symptoms. Return precautions given Pt acknowledges and agrees with plan  Supervising Physician Raeford Razor, MD  Final diagnoses:  Cellulitis of little finger    New Prescriptions New Prescriptions   No medications  on file     Audry Pili, PA-C 04/01/17 1141    Raeford Razor, MD 04/01/17 1336

## 2017-04-01 NOTE — Discharge Instructions (Signed)
Please read and follow all provided instructions.  Your diagnoses today include:  1. Cellulitis of little finger     Tests performed today include: Vital signs. See below for your results today.   Medications prescribed:  Take as prescribed   Home care instructions:  Follow any educational materials contained in this packet.  Follow-up instructions: Please follow-up with the Orthopedic Hand surgeon originally referred for further evaluation of symptoms and treatment   Return instructions:  Please return to the Emergency Department if you do not get better, if you get worse, or new symptoms OR  - Fever (temperature greater than 101.43F)  - Bleeding that does not stop with holding pressure to the area    -Severe pain (please note that you may be more sore the day after your accident)  - Chest Pain  - Difficulty breathing  - Severe nausea or vomiting  - Inability to tolerate food and liquids  - Passing out  - Skin becoming red around your wounds  - Change in mental status (confusion or lethargy)  - New numbness or weakness    Please return if you have any other emergent concerns.  Additional Information:  Your vital signs today were: BP (!) 145/102 (BP Location: Left Arm)    Pulse 82    Temp 98.2 F (36.8 C) (Oral)    Resp 16    Ht  (1.803 m)    Wt 88 kg    SpO2 100%    BMI 27.06 kg/m  If your blood pressure (BP) was elevated above 135/85 this visit, please have this repeated by your doctor within one month. ---------------

## 2018-12-20 ENCOUNTER — Encounter (HOSPITAL_COMMUNITY): Payer: Self-pay | Admitting: Emergency Medicine

## 2018-12-20 ENCOUNTER — Emergency Department (HOSPITAL_COMMUNITY): Payer: BLUE CROSS/BLUE SHIELD

## 2018-12-20 ENCOUNTER — Emergency Department (HOSPITAL_COMMUNITY)
Admission: EM | Admit: 2018-12-20 | Discharge: 2018-12-20 | Disposition: A | Discharge status: Home / Self Care | Payer: BLUE CROSS/BLUE SHIELD | Attending: Emergency Medicine | Admitting: Emergency Medicine

## 2018-12-20 DIAGNOSIS — J45909 Unspecified asthma, uncomplicated: Secondary | ICD-10-CM | POA: Insufficient documentation

## 2018-12-20 DIAGNOSIS — Z79899 Other long term (current) drug therapy: Secondary | ICD-10-CM | POA: Diagnosis not present

## 2018-12-20 DIAGNOSIS — K610 Anal abscess: Secondary | ICD-10-CM | POA: Insufficient documentation

## 2018-12-20 DIAGNOSIS — R600 Localized edema: Secondary | ICD-10-CM | POA: Diagnosis present

## 2018-12-20 DIAGNOSIS — F17210 Nicotine dependence, cigarettes, uncomplicated: Secondary | ICD-10-CM | POA: Insufficient documentation

## 2018-12-20 LAB — CBC
HCT: 44.3 % (ref 39.0–52.0)
Hemoglobin: 14.9 g/dL (ref 13.0–17.0)
MCH: 31 pg (ref 26.0–34.0)
MCHC: 33.6 g/dL (ref 30.0–36.0)
MCV: 92.1 fL (ref 80.0–100.0)
Platelets: 223 10*3/uL (ref 150–400)
RBC: 4.81 MIL/uL (ref 4.22–5.81)
RDW: 12.2 % (ref 11.5–15.5)
WBC: 13.4 10*3/uL — ABNORMAL HIGH (ref 4.0–10.5)
nRBC: 0 % (ref 0.0–0.2)

## 2018-12-20 LAB — BASIC METABOLIC PANEL
Anion gap: 10 (ref 5–15)
BUN: 13 mg/dL (ref 6–20)
CO2: 27 mmol/L (ref 22–32)
Calcium: 9.2 mg/dL (ref 8.9–10.3)
Chloride: 104 mmol/L (ref 98–111)
Creatinine, Ser: 1.12 mg/dL (ref 0.61–1.24)
GFR calc non Af Amer: >60 mL/min (ref >60)
Glucose, Bld: 100 mg/dL — ABNORMAL HIGH (ref 70–99)
Potassium: 4 mmol/L (ref 3.5–5.1)
Sodium: 141 mmol/L (ref 135–145)

## 2018-12-20 MED ORDER — SODIUM CHLORIDE 0.9 % IV BOLUS
1000.0000 mL | Freq: Once | INTRAVENOUS | Status: AC
  Administered 2018-12-20: 1000 mL via INTRAVENOUS

## 2018-12-20 MED ORDER — LIDOCAINE-EPINEPHRINE (PF) 2 %-1:200000 IJ SOLN
20.0000 mL | Freq: Once | INTRAMUSCULAR | Status: AC
  Administered 2018-12-20: 20 mL
  Filled 2018-12-20: qty 20

## 2018-12-20 MED ORDER — MORPHINE SULFATE (PF) 4 MG/ML IV SOLN
8.0000 mg | Freq: Once | INTRAVENOUS | Status: AC
  Administered 2018-12-20: 8 mg via INTRAVENOUS
  Filled 2018-12-20: qty 2

## 2018-12-20 MED ORDER — DOXYCYCLINE HYCLATE 100 MG PO TABS
100.0000 mg | ORAL_TABLET | Freq: Once | ORAL | Status: AC
  Administered 2018-12-20: 100 mg via ORAL
  Filled 2018-12-20: qty 1

## 2018-12-20 MED ORDER — IOPAMIDOL (ISOVUE-300) INJECTION 61%
100.0000 mL | Freq: Once | INTRAVENOUS | Status: AC | PRN
  Administered 2018-12-20: 100 mL via INTRAVENOUS

## 2018-12-20 MED ORDER — HYDROMORPHONE HCL 1 MG/ML IJ SOLN
1.0000 mg | Freq: Once | INTRAMUSCULAR | Status: AC
  Administered 2018-12-20: 1 mg via INTRAVENOUS
  Filled 2018-12-20: qty 1

## 2018-12-20 MED ORDER — SODIUM CHLORIDE (PF) 0.9 % IJ SOLN
INTRAMUSCULAR | Status: AC
  Filled 2018-12-20: qty 50

## 2018-12-20 MED ORDER — DOXYCYCLINE HYCLATE 100 MG PO CAPS: 100.0000 mg | ORAL_CAPSULE | Freq: Two times a day (BID) | ORAL | 0 refills | Status: DC

## 2018-12-20 MED ORDER — IOPAMIDOL (ISOVUE-300) INJECTION 61%
INTRAVENOUS | Status: AC
  Filled 2018-12-20: qty 100

## 2018-12-20 NOTE — ED Triage Notes (Signed)
Pt reports has rectal abscess since Monday and was sent from urgent care. Reports that he had fever of 99.8 last night.

## 2018-12-20 NOTE — ED Provider Notes (Signed)
[facility]-EMERGENCY DEPT Provider Note   CSN: [id number] Arrival date & time: 12/20/18  1011     History   Chief Complaint Chief Complaint  Patient presents with  . Abscess    rectal     HPI Tony Mata is a 32 y.o. male.  HPI Pt is a 32 yo male presenting to the ER with increasing rectal pain over the past 4-5 days. Seen at urgent care and sent to the ER with concern for perianal abscess. Chills at home without documented fever. No vomiting. Decreased BM x 2 days. No diarrhea. No hx of the same. Pain is severe.   Past Medical History:  Diagnosis Date  . Allergy   . Asthma   . Hypertension     Patient Active Problem List   Diagnosis Date Noted  . Boxer's fracture 02/19/2015  . Dysphagia, pharyngoesophageal phase 01/20/2014    Past Surgical History:  Procedure Laterality Date  . ESOPHAGOGASTRODUODENOSCOPY N/A 12/03/2013   Procedure: ESOPHAGOGASTRODUODENOSCOPY (EGD);  Surgeon: Rachael Feeaniel P Jacobs, MD;  [facility];  Service: Endoscopy;  Laterality: N/A;  . UPPER GASTROINTESTINAL ENDOSCOPY          Home Medications    Prior to Admission medications   Medication Sig Start Date End Date Taking? Authorizing Provider  levocetirizine (XYZAL) 5 MG tablet Take 5 mg by mouth every evening.   Yes [provider]  montelukast (SINGULAIR) 10 MG tablet Take 10 mg by mouth at bedtime.   Yes [provider]  PROAIR HFA 108 (90 BASE) MCG/ACT inhaler Inhale 1-2 puffs into the lungs every 6 (six) hours as needed for wheezing or shortness of breath.  02/04/15  Yes [provider]  fluticasone (FLOVENT HFA) 220 MCG/ACT inhaler Please swallow 2 sprays twice a day. Rinse mouth with water and do not swallow the water. Do not eat or drink for 30 min after swallowing the spray. Patient not taking: Reported on 12/20/2018 03/11/14   Rachael FeeJacobs, Daniel P, MD    Family History Family History  Problem Relation Age of Onset  . Heart  disease Father     Social History Social History   Tobacco Use  . Smoking status: Current Some Day Smoker    Types: Cigarettes  . Smokeless tobacco: Never Used  Substance Use Topics  . Alcohol use: Yes    Alcohol/week: 0.0 standard drinks    Comment: 1-2times per week  . Drug use: No     Allergies   Patient has no known allergies.   Review of Systems Review of Systems  All other systems reviewed and are negative.    Physical Exam Updated Vital Signs BP (!) 135/114 (BP Location: Left Arm)   Pulse 79   Temp 98.2 F (36.8 C) (Oral)   Resp 19   Wt 94.8 kg   SpO2 98%   BMI 29.15 kg/m   Physical Exam Vitals signs and nursing note reviewed.  Constitutional:      Appearance: He is well-developed.  HENT:     Head: Normocephalic.  Neck:     Musculoskeletal: Normal range of motion.  Pulmonary:     Effort: Pulmonary effort is normal.  Abdominal:     General: There is no distension.  Genitourinary:    Comments: Left sided perianal swelling and fluctuance without drainage or surrounding erythema Musculoskeletal: Normal range of motion.  Neurological:     Mental Status: He is alert and oriented to person, place, and time.  ED Treatments / Results  Labs (all labs ordered are listed, but only abnormal results are displayed) Labs Reviewed  CBC - Abnormal; Notable for the following components:      Result Value   WBC 13.4 (*)    All other components within normal limits  BASIC METABOLIC PANEL - Abnormal; Notable for the following components:   Glucose, Bld 100 (*)    All other components within normal limits    EKG None  Radiology Ct Pelvis W Contrast  Result Date: 12/20/2018 CLINICAL DATA:  32 year old male with rectal pain and fever for several days. EXAM: CT PELVIS WITH CONTRAST TECHNIQUE: Multidetector CT imaging of the pelvis was performed using the standard protocol following the bolus administration of intravenous contrast. CONTRAST:  100mL  ISOVUE-300 IOPAMIDOL (ISOVUE-300) INJECTION 61% COMPARISON:  None. FINDINGS: Urinary Tract:  No abnormality visualized. Bowel: A 2 x 2.5 x 3 cm ill-defined RIGHT perianal collection likely represents an abscess. Unremarkable visualized pelvic bowel loops otherwise. Visualized appendix is normal. Vascular/Lymphatic: No pathologically enlarged lymph nodes. No significant vascular abnormality seen. Reproductive:  Prostate unremarkable Other:  No ascites, pneumoperitoneum or abdominal wall hernia. Musculoskeletal: No suspicious bone lesions identified. IMPRESSION: 1. 2 x 2.5 x 3 cm ill-defined RIGHT perianal collection/abscess. 2. No other significant abnormalities. Electronically Signed   By: Harmon PierJeffrey  Hu M.D.   On: 12/20/2018 13:49    Procedures .Marland Kitchen.Incision and Drainage Performed by: Azalia Bilisampos, Errik Mitchelle, MD Authorized by: Azalia Bilisampos, Boy Delamater, MD     INCISION AND DRAINAGE Performed by: Azalia BilisKevin Lenea Bywater Consent: Verbal consent obtained. Risks and benefits: risks, benefits and alternatives were discussed Time out performed prior to procedure Type: abscess Body area: perianal Anesthesia: local infiltration Incision was made with a scalpel. Local anesthetic: lidocaine 2% with epinephrine Anesthetic total: 5 ml Complexity: complex Blunt dissection to break up loculations Drainage: purulent Drainage amount: moderate Packing material: none Patient tolerance: Patient tolerated the procedure well with no immediate complications.     Medications Ordered in ED Medications  iopamidol (ISOVUE-300) 61 % injection (has no administration in time range)  sodium chloride (PF) 0.9 % injection (has no administration in time range)  HYDROmorphone (DILAUDID) injection 1 mg (has no administration in time range)  lidocaine-EPINEPHrine (XYLOCAINE W/EPI) 2 %-1:200000 (PF) injection 20 mL (has no administration in time range)  morphine 4 MG/ML injection 8 mg (8 mg Intravenous Given 12/20/18 1139)  sodium chloride 0.9 % bolus  1,000 mL (0 mLs Intravenous Stopped 12/20/18 1316)  iopamidol (ISOVUE-300) 61 % injection 100 mL (100 mLs Intravenous Contrast Given 12/20/18 1319)     Initial Impression / Assessment and Plan / ED Course  I have reviewed the triage vital signs and the nursing notes.  Pertinent labs & imaging results that were available during my care of the patient were reviewed by me and considered in my medical decision making (see chart for details).     Patient tolerated the procedure well.  Feels much better after incision and drainage of perianal abscess.  No significant perirectal spread.  Home on antibiotics.  Primary care follow-up.  Patient understands return to the ER for new or worsening symptoms.  If this were to recur or does not heal appropriately he will need [place]management for operative incision and drainage  Final Clinical Impressions(s) / ED Diagnoses   Final diagnoses:  Perianal abscess    ED Discharge Orders    None       Azalia Bilisampos, Eldana Isip, MD 12/22/18 [phone]

## 2019-08-05 ENCOUNTER — Other Ambulatory Visit: Payer: Self-pay

## 2019-08-05 ENCOUNTER — Emergency Department (HOSPITAL_COMMUNITY)
Admission: EM | Admit: 2019-08-05 | Discharge: 2019-08-05 | Disposition: A | Discharge status: Home / Self Care | Payer: BC Managed Care – PPO | Attending: Emergency Medicine | Admitting: Emergency Medicine

## 2019-08-05 ENCOUNTER — Encounter (HOSPITAL_COMMUNITY): Payer: Self-pay

## 2019-08-05 DIAGNOSIS — F17210 Nicotine dependence, cigarettes, uncomplicated: Secondary | ICD-10-CM | POA: Diagnosis not present

## 2019-08-05 DIAGNOSIS — K610 Anal abscess: Secondary | ICD-10-CM | POA: Diagnosis present

## 2019-08-05 DIAGNOSIS — I10 Essential (primary) hypertension: Secondary | ICD-10-CM | POA: Diagnosis not present

## 2019-08-05 DIAGNOSIS — J45909 Unspecified asthma, uncomplicated: Secondary | ICD-10-CM | POA: Diagnosis not present

## 2019-08-05 MED ORDER — LIDOCAINE-PRILOCAINE 2.5-2.5 % EX CREA
TOPICAL_CREAM | Freq: Once | CUTANEOUS | Status: AC
  Administered 2019-08-05: 1 via TOPICAL
  Filled 2019-08-05: qty 5

## 2019-08-05 MED ORDER — DOXYCYCLINE HYCLATE 100 MG PO CAPS: 100.0000 mg | ORAL_CAPSULE | Freq: Two times a day (BID) | ORAL | 0 refills | Status: AC

## 2019-08-05 MED ORDER — IBUPROFEN 200 MG PO TABS
600.0000 mg | ORAL_TABLET | Freq: Once | ORAL | Status: AC
  Administered 2019-08-05: 12:00:00 600 mg via ORAL
  Filled 2019-08-05: qty 3

## 2019-08-05 MED ORDER — LIDOCAINE-EPINEPHRINE (PF) 2 %-1:200000 IJ SOLN
10.0000 mL | Freq: Once | INTRAMUSCULAR | Status: AC
  Administered 2019-08-05: 10 mL
  Filled 2019-08-05: qty 10

## 2019-08-05 MED ORDER — HYDROCODONE-ACETAMINOPHEN 5-325 MG PO TABS
1.0000 | ORAL_TABLET | Freq: Once | ORAL | Status: AC
  Administered 2019-08-05: 12:00:00 1 via ORAL
  Filled 2019-08-05: qty 1

## 2019-08-05 NOTE — ED Provider Notes (Signed)
[facility] DEPT Provider Note   CSN: [id number] Arrival date & time: 08/05/19  1011    History   Chief Complaint Chief Complaint  Patient presents with  . Abscess    HPI Tony Mata is a 33 y.o. male.     HPI    Tony Mata is a 33 y.o. male, with a history of asthma, HTN, perianal abscess, presenting to the ED with perianal abscess for the last week.  Has tried neosporin and cortisone cream.  Had a similar abscess in the same area in Dec 2019, I&D, and put on doxycycline.  States he had some doxycycline left over from previous treatment and started taking this again.   Denies fever/chills, abdominal pain, urinary symptoms, or any other complaints.     Past Medical History:  Diagnosis Date  . Allergy   . Asthma   . Hypertension     Patient Active Problem List   Diagnosis Date Noted  . Boxer's fracture 02/19/2015  . Dysphagia, pharyngoesophageal phase 01/20/2014    Past Surgical History:  Procedure Laterality Date  . ESOPHAGOGASTRODUODENOSCOPY N/A 12/03/2013   Procedure: ESOPHAGOGASTRODUODENOSCOPY (EGD);  Surgeon: Milus Banister, MD;  Location: [facility];  Service: Endoscopy;  Laterality: N/A;  . UPPER GASTROINTESTINAL ENDOSCOPY          Home Medications    Prior to Admission medications   Medication Sig Start Date End Date Taking? Authorizing Provider  ibuprofen (ADVIL) 200 MG tablet Take 800 mg by mouth every 6 (six) hours as needed for fever, headache or moderate pain.   Yes [provider]  mometasone (NASONEX) 50 MCG/ACT nasal spray Place 2 sprays into the nose daily as needed (congestion).   Yes [provider]  montelukast (SINGULAIR) 10 MG tablet Take 10 mg by mouth at bedtime.   Yes [provider]  Multiple Vitamin (MULTIVITAMIN WITH MINERALS) TABS tablet Take 1 tablet by mouth daily.   Yes [provider]  naproxen sodium (ALEVE) 220 MG tablet Take 440 mg by  mouth 2 (two) times daily as needed (headache/pain).   Yes [provider]  PROAIR HFA 108 (90 BASE) MCG/ACT inhaler Inhale 1-2 puffs into the lungs every 6 (six) hours as needed for wheezing or shortness of breath.  02/04/15  Yes [provider]  doxycycline (VIBRAMYCIN) 100 MG capsule Take 1 capsule (100 mg total) by mouth 2 (two) times daily for 7 days. 08/05/19 08/12/19  Sheyann Sulton C, PA-C  fluticasone (FLOVENT HFA) 220 MCG/ACT inhaler Please swallow 2 sprays twice a day. Rinse mouth with water and do not swallow the water. Do not eat or drink for 30 min after swallowing the spray. Patient not taking: Reported on 12/20/2018 03/11/14   Milus Banister, MD    Family History Family History  Problem Relation Age of Onset  . Heart disease Father     Social History Social History   Tobacco Use  . Smoking status: Current Some Day Smoker    Types: Cigarettes  . Smokeless tobacco: Never Used  Substance Use Topics  . Alcohol use: Yes    Alcohol/week: 0.0 standard drinks    Comment: 1-2times per week  . Drug use: No     Allergies   Patient has no known allergies.   Review of Systems Review of Systems  Constitutional: Negative for chills and fever.  Gastrointestinal: Negative for abdominal pain, constipation, diarrhea, nausea and vomiting.       Perianal abscess  Musculoskeletal: Negative for back pain.  All other systems reviewed and are negative.    Physical Exam Updated Vital Signs BP 135/89 (BP Location: Left Arm)   Pulse 83   Temp 97.9 F (36.6 C) (Oral)   Resp 16   Ht 5\' 11"  (1.803 m)   Wt 95.3 kg   SpO2 100%   BMI 29.29 kg/m   Physical Exam Vitals signs and nursing note reviewed.  Constitutional:      General: He is not in acute distress.    Appearance: He is well-developed. He is not diaphoretic.  HENT:     Head: Normocephalic and atraumatic.     Mouth/Throat:     Mouth: Mucous membranes are moist.     Pharynx: Oropharynx is clear.   Eyes:     Conjunctiva/sclera: Conjunctivae normal.  Neck:     Musculoskeletal: Neck supple.  Cardiovascular:     Rate and Rhythm: Normal rate and regular rhythm.     Pulses: Normal pulses.  Pulmonary:     Effort: Pulmonary effort is normal. No respiratory distress.  Abdominal:     Palpations: Abdomen is soft.     Tenderness: There is no abdominal tenderness. There is no guarding.     Comments: No tenderness to the abdomen, even with deep palpation.  Genitourinary:    Comments: Patient has a small area of fluctuance approximately 1.5 cm x 1 cm to the left buttock skin near the anus.  Does not appear to involve the anal sphincter.  There is no tenderness to the sphincter or just inside the rectum.  There is tenderness and some erythema extending out from the area of fluctuance into the skin of the buttock. PA student, Cameon, served as Biomedical engineerchaperone during exam. Musculoskeletal:     Right lower leg: No edema.     Left lower leg: No edema.  Lymphadenopathy:     Cervical: No cervical adenopathy.  Skin:    General: Skin is warm and dry.  Neurological:     Mental Status: He is alert.  Psychiatric:        Mood and Affect: Mood and affect normal.        Speech: Speech normal.        Behavior: Behavior normal.      ED Treatments / Results  Labs (all labs ordered are listed, but only abnormal results are displayed) Labs Reviewed - No data to display  EKG None  Radiology No results found.  Procedures Ultrasound ED Soft Tissue  Date/Time: 08/05/2019 11:30 AM Performed by: Anselm PancoastJoy, Randell Detter C, PA-C Authorized by: Anselm PancoastJoy, Kaniah Rizzolo C, PA-C   Procedure details:    Indications: localization of abscess and evaluate for cellulitis     Transverse view:  Visualized   Longitudinal view:  Visualized   Images: archived   Location:    Location: buttocks     Side:  Left Findings:     abscess present    cellulitis present .Marland Kitchen.Incision and Drainage  Date/Time: 08/05/2019 11:25 AM Performed by: Anselm PancoastJoy,  Xaidyn Kepner C, PA-C Authorized by: Anselm PancoastJoy, Karalyne Nusser C, PA-C   Consent:    Consent obtained:  Verbal   Consent given by:  Patient   Risks discussed:  Bleeding, damage to other organs, incomplete drainage and pain Location:    Type:  Abscess   Size:  1.5   Location:  Anogenital   Anogenital location:  Perianal Pre-procedure details:    Skin preparation:  Betadine Anesthesia (see MAR for exact dosages):  Anesthesia method:  Topical application and local infiltration   Topical anesthetic:  EMLA cream   Local anesthetic:  Lidocaine 2% WITH epi Procedure type:    Complexity:  Simple Procedure details:    Incision types:  Stab incision   Incision depth:  Subcutaneous   Scalpel blade:  11   Wound management:  Irrigated with saline   Drainage:  Bloody and purulent   Drainage amount:  Scant   Wound treatment:  Wound left open Post-procedure details:    Patient tolerance of procedure:  Tolerated well, no immediate complications   (including critical care time)  Medications Ordered in ED Medications  lidocaine-EPINEPHrine (XYLOCAINE W/EPI) 2 %-1:200000 (PF) injection 10 mL (10 mLs Infiltration Given by Other 08/05/19 1142)  ibuprofen (ADVIL) tablet 600 mg (600 mg Oral Given 08/05/19 1142)  HYDROcodone-acetaminophen (NORCO/VICODIN) 5-325 MG per tablet 1 tablet (1 tablet Oral Given 08/05/19 1142)  lidocaine-prilocaine (EMLA) cream (1 application Topical Given 08/05/19 1344)     Initial Impression / Assessment and Plan / ED Course  I have reviewed the triage vital signs and the nursing notes.  Pertinent labs & imaging results that were available during my care of the patient were reviewed by me and considered in my medical decision making (see chart for details).        Patient presents with perianal abscess. Patient is nontoxic appearing, afebrile, not tachycardic, not tachypneic, not hypotensive, and is in no apparent distress.  It does not appear to extend to the anal sphincter or into the  rectum based on lack of tenderness in these regions.  However, due to its proximity to the anus, it was again recommended to the patient that he follow-up with general surgery.  Antibiotic therapy initiated.  I also stressed the importance of finishing his antibiotic course. The patient was given instructions for home care as well as return precautions. Patient voices understanding of these instructions, accepts the plan, and is comfortable with discharge.   Vitals:   08/05/19 1016 08/05/19 1019 08/05/19 1342  BP: 135/89  (!) 144/92  Pulse: 83  94  Resp: 16  15  Temp: 97.9 F (36.6 C)    TempSrc: Oral    SpO2: 100%  100%  Weight:  95.3 kg   Height:  5\' 11"  (1.803 m)     Final Clinical Impressions(s) / ED Diagnoses   Final diagnoses:  Perianal abscess    ED Discharge Orders         Ordered    doxycycline (VIBRAMYCIN) 100 MG capsule  2 times daily     08/05/19 1315           Anselm PancoastJoy, Conchetta Lamia C, PA-C 08/06/19 1414    Bethann BerkshireZammit, Joseph, MD 08/12/19 1302

## 2019-08-05 NOTE — Discharge Instructions (Addendum)
Wound Care - After I&D of Abscess  You may remove the bandage after 24 hours.  The only reason to replace the bandage is to protect clothing from drainage. Bandages, if used, should be replaced daily or whenever soiled. The wound may continue to drain for the next 2-3 days.   Please take all of your antibiotics until finished!   You may develop abdominal discomfort or diarrhea from the antibiotic.  You may help offset this with probiotics which you can buy or get in yogurt. Do not eat or take the probiotics until 2 hours after your antibiotic.   Cleaning: Clean the wound and surrounding area gently with tap water and mild soap. Rinse well and blot dry. You may shower normally. Soaking the wound in Epsom salt baths for no more than 15 minutes once a day may help rinse out any remaining pus and help with wound healing.  Clean the wound daily to prevent further infection. Do not use cleaners such as hydrogen peroxide or alcohol.   Scar reduction: Application of a topical antibiotic ointment, such as Neosporin, after the wound has begun to close and heal well can decrease scab formation and reduce scarring. After the wound has healed, application of ointments such as Aquaphor can also reduce scar formation.  The key to scar reduction is keeping the skin well hydrated and supple. Drinking plenty of water throughout the day (At least eight 8oz glasses of water a day) is essential to staying well hydrated.  Sun exposure: Keep the wound out of the sun. After the wound has healed, continue to protect it from the sun by wearing protective clothing or applying sunscreen.  Pain: You may use Tylenol, naproxen, or ibuprofen for pain. Antiinflammatory medications: Take 600 mg of ibuprofen every 6 hours or 440 mg (over the counter dose) to 500 mg (prescription dose) of naproxen every 12 hours for the next 3 days. After this time, these medications may be used as needed for pain. Take these medications with food to  avoid upset stomach. Choose only one of these medications, do not take them together. Acetaminophen (generic for Tylenol): Should you continue to have additional pain while taking the ibuprofen or naproxen, you may add in acetaminophen as needed. Your daily total maximum amount of acetaminophen from all sources should be limited to 4000mg /day for persons without liver problems, or 2000mg /day for those with liver problems.  Prevention: There is some people that have a predisposition to abscess formation, however, there are some things that can be done to prevent abscesses in many people.  Most abscesses form because bacteria that naturally lives on the skin gets trapped underneath the skin.  This can occur through openings too small to see. Before and after any area of skin is shaved, wax, or abraded in any manner, the area should be washed with soap and water and rinsed well.   If you are having trouble with recurrent abscesses, it may be wise to perform a chlorhexidine wash regimen.  For 1 week, wash all of your body with chlorhexidine (available over-the-counter at most pharmacies). You may also need to reevaluate your use of daily soap as soaps with perfumes or dyes can increase the chances of infection in some people.  Follow up: Please return to the ED or go to your primary care provider in 2-3 days for a wound check to assure proper healing.  Return: Return to the ED sooner should signs of worsening infection arise, such as spreading redness, worsening  puffiness/swelling, severe increase in pain, fever over 100.31F, or any other major issues.  For prescription assistance, may try using prescription discount sites or apps, such as goodrx.com

## 2019-08-05 NOTE — ED Triage Notes (Signed)
Pt reports abscess between buttock x 2 weeks. Pt denies any drainage at this time.

## 2020-04-28 IMAGING — CT CT PELVIS W/ CM
2 of 3 series · 17 of 46 positions shown, 19 images · IV contrast (ISOVUE)
Comparison: None.

CLINICAL DATA: 32-year-old male with rectal pain and fever for
several days.

EXAM:
CT PELVIS WITH CONTRAST
TECHNIQUE: Multidetector CT imaging of the pelvis was performed using the
standard protocol following the bolus administration of intravenous
contrast.
CONTRAST:  100mL GL3XBU-1EE IOPAMIDOL (GL3XBU-1EE) INJECTION 61%

[Series 2: axial st · axial · 0.80mm/px · z∈[-488,-208]mm · 14 of 66 slices shown, 16 images]
[im 5/66  soft-tissue]
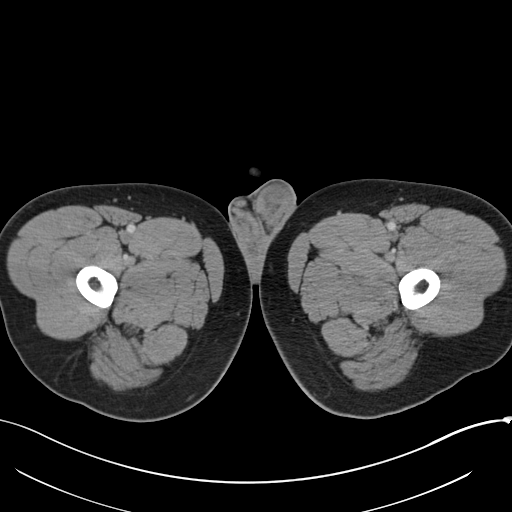
[im 5/66  bone]
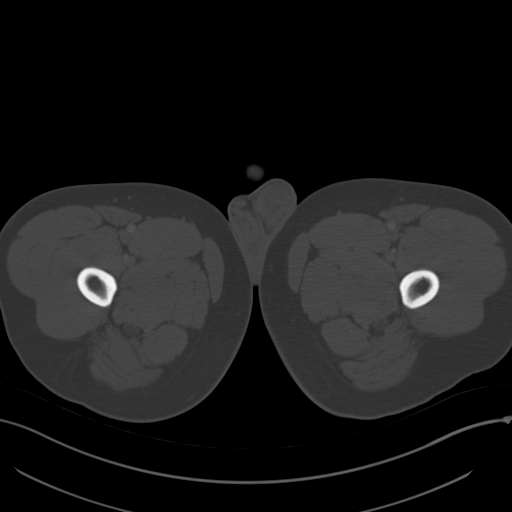
[im 9/66  soft-tissue]
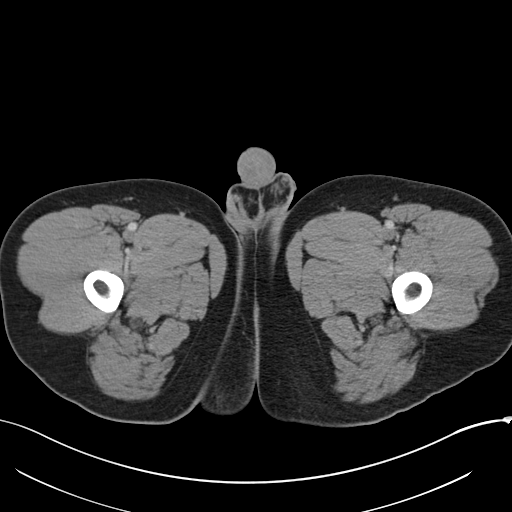
[im 13/66  soft-tissue]
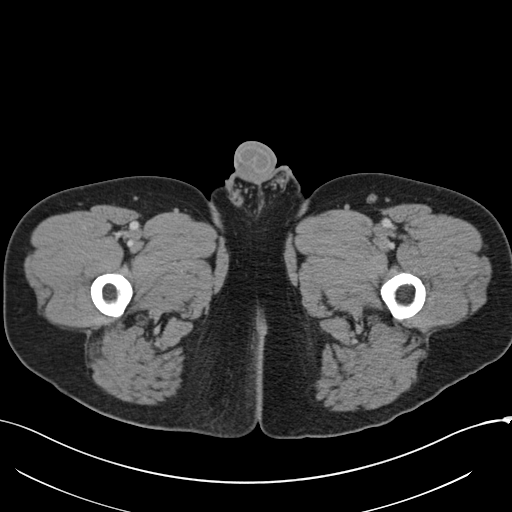
[im 17/66  soft-tissue]
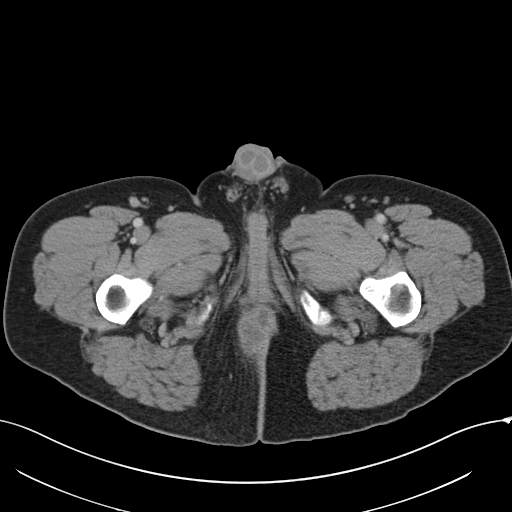
[im 21/66  soft-tissue]
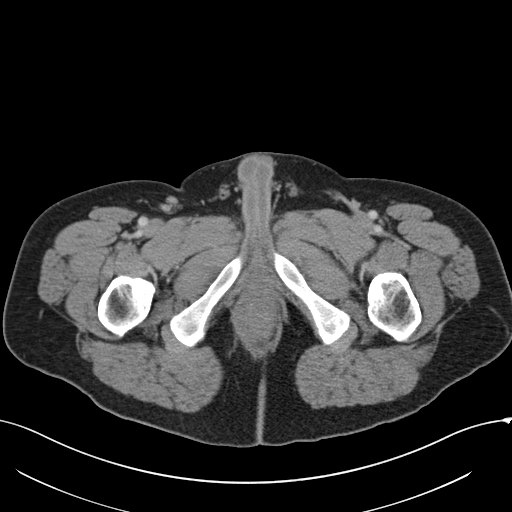
[im 26/66  soft-tissue]
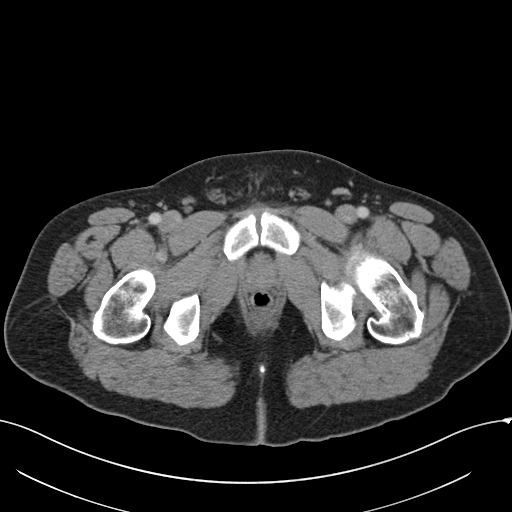
[im 30/66  soft-tissue]
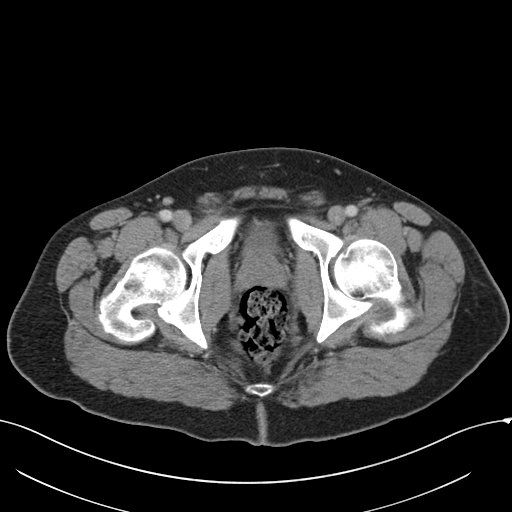
[im 36/66  soft-tissue]
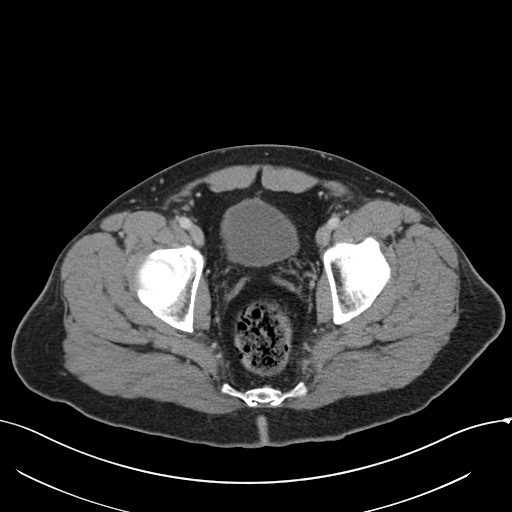
[im 40/66  soft-tissue]
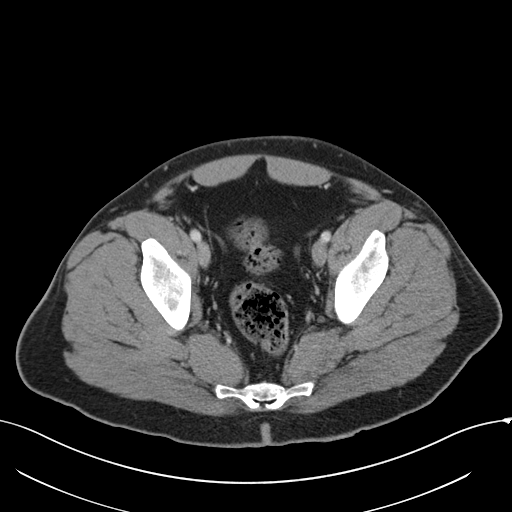
[im 40/66  bone]
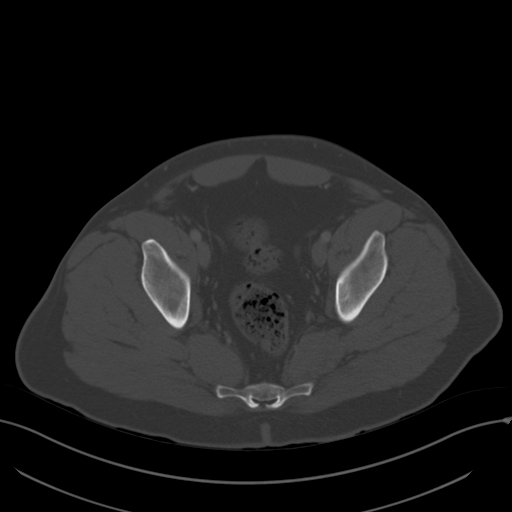
[im 45/66  soft-tissue]
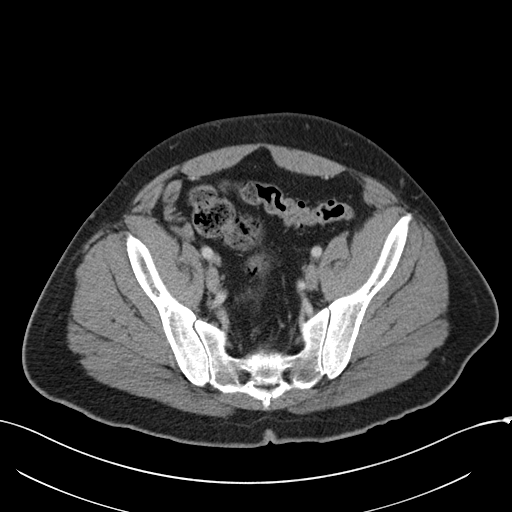
[im 49/66  soft-tissue]
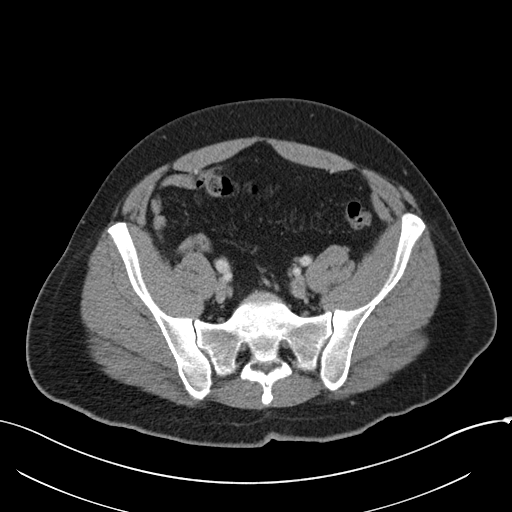
[im 53/66  soft-tissue]
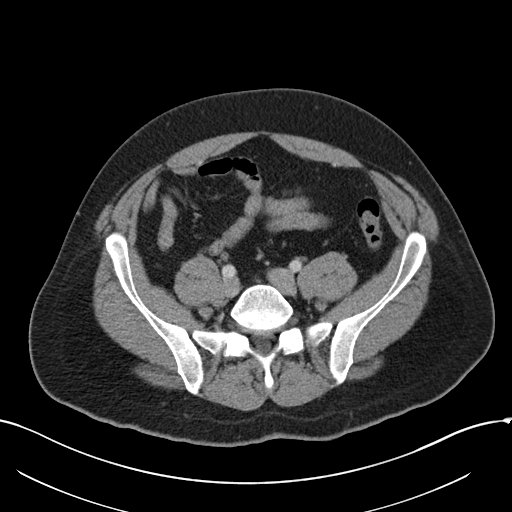
[im 57/66  soft-tissue]
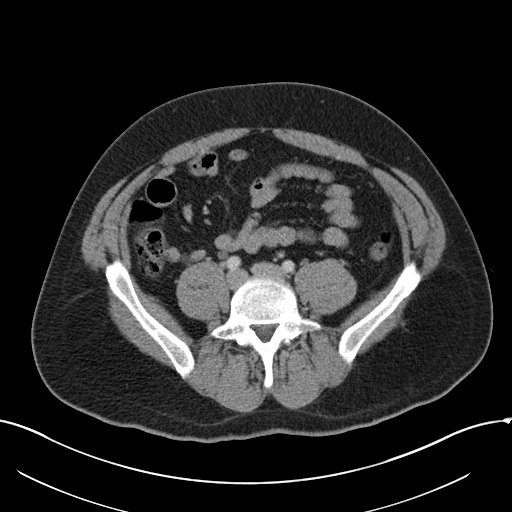
[im 61/66  soft-tissue]
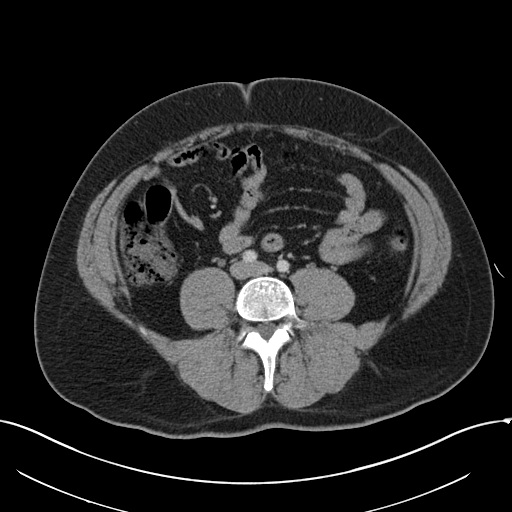

[Series 602: <mpr thick range> · coronal · 0.80mm/px · 3 of 153 slices shown]
[im 51/153  soft-tissue]
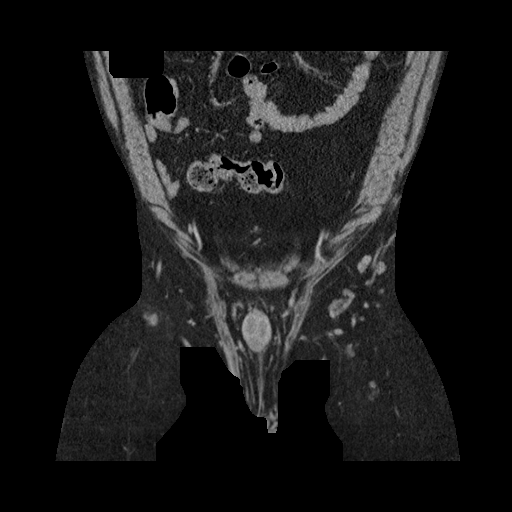
[im 68/153  soft-tissue]
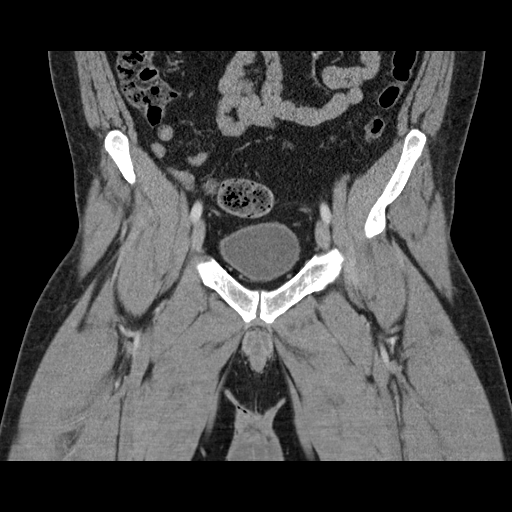
[im 85/153  soft-tissue]
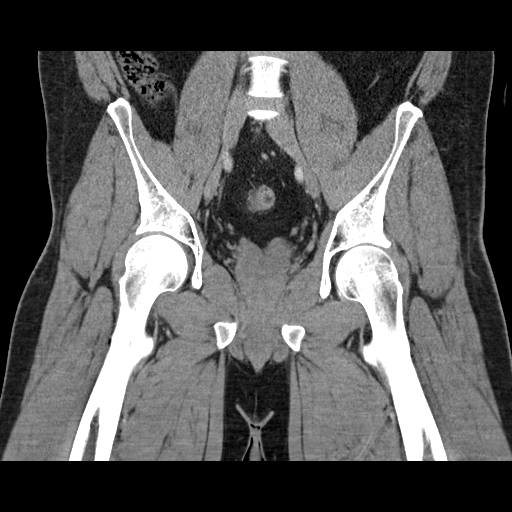

[17 of 46 positions shown; findings below may reference images not displayed]

FINDINGS: Urinary Tract:  No abnormality visualized.

Bowel: A 2 x 2.5 x 3 cm ill-defined RIGHT perianal collection likely
represents an abscess. Unremarkable visualized pelvic bowel loops
otherwise. Visualized appendix is normal.

Vascular/Lymphatic: No pathologically enlarged lymph nodes. No
significant vascular abnormality seen.

Reproductive:  Prostate unremarkable

Other:  No ascites, pneumoperitoneum or abdominal wall hernia.

Musculoskeletal: No suspicious bone lesions identified.
IMPRESSION: 1. 2 x 2.5 x 3 cm ill-defined RIGHT perianal collection/abscess.
2. No other significant abnormalities.

## 2021-12-26 ENCOUNTER — Emergency Department (HOSPITAL_COMMUNITY)
Admission: EM | Admit: 2021-12-26 | Discharge: 2021-12-26 | Discharge status: Absent without leave | Payer: BC Managed Care – PPO | Source: Home / Self Care

## 2021-12-26 ENCOUNTER — Other Ambulatory Visit: Payer: Self-pay

## 2021-12-29 ENCOUNTER — Other Ambulatory Visit: Payer: Self-pay

## 2021-12-29 ENCOUNTER — Encounter (HOSPITAL_COMMUNITY): Payer: Self-pay

## 2021-12-29 ENCOUNTER — Emergency Department (HOSPITAL_COMMUNITY): Payer: BC Managed Care – PPO

## 2021-12-29 ENCOUNTER — Emergency Department (HOSPITAL_COMMUNITY)
Admission: EM | Admit: 2021-12-29 | Discharge: 2021-12-29 | Disposition: A | Discharge status: Home / Self Care | Payer: BC Managed Care – PPO | Attending: Emergency Medicine | Admitting: Emergency Medicine

## 2021-12-29 DIAGNOSIS — R0989 Other specified symptoms and signs involving the circulatory and respiratory systems: Secondary | ICD-10-CM | POA: Diagnosis not present

## 2021-12-29 DIAGNOSIS — M795 Residual foreign body in soft tissue: Secondary | ICD-10-CM

## 2021-12-29 DIAGNOSIS — Z5321 Procedure and treatment not carried out due to patient leaving prior to being seen by health care provider: Secondary | ICD-10-CM | POA: Insufficient documentation

## 2021-12-29 NOTE — ED Provider Triage Note (Signed)
Emergency Medicine Provider Triage Evaluation Note  Tony Mata , a 36 y.o. male  was evaluated in triage.  Pt complains of foreign body sensation in the throat.  He does have a similar history in the past with previous meat.  He has required esophageal dilation in the past.  Patient states he was eating chicken when a piece got stuck.  He is been unable to tolerate secretions since then.  Review of Systems  Positive:  Negative: See above   Physical Exam  BP (!) 158/110 (BP Location: Right Arm)    Pulse 95    Temp (!) 97.5 F (36.4 C) (Oral)    Resp 19    Ht 5\' 11"  (1.803 m)    Wt 104.3 kg    SpO2 100%    BMI 32.08 kg/m  Gen:   Awake, no distress   Resp:  Normal effort  MSK:   Moves extremities without difficulty  Other:    Medical Decision Making  Medically screening exam initiated at 8:38 PM.  Appropriate orders placed.  Jaynee Eagles was informed that the remainder of the evaluation will be completed by another provider, this initial triage assessment does not replace that evaluation, and the importance of remaining in the ED until their evaluation is complete.  I believe this patient is in need of emergent medical care at this time.  We will keep him in the triage area and on the monitor to keep an eye out for respiratory distress.  Currently he is breathing normally and maintaining good airway.  He is unable to tolerate his secretions at this time though.   Myna Bright [facility], [place] 12/29/21 2039

## 2021-12-29 NOTE — ED Triage Notes (Signed)
Pt reports eating chicken tonight and has possible bone stuck in throat. Hx of same with multiple occurrences since 2014. Unable to swallow or tolerate fluids.

## 2021-12-29 NOTE — ED Notes (Signed)
Pt stated he was leaving that object stuck in throat passed

## 2023-05-08 IMAGING — CR DG NECK SOFT TISSUE
2 series · 2 of 2 positions shown · non-contrast
Comparison: 05/16/2021.

CLINICAL DATA: Possible foreign body, question chicken bone stuck
in throat.

EXAM:
NECK SOFT TISSUES - 1+ VIEW

[w soft tissue neck lat]
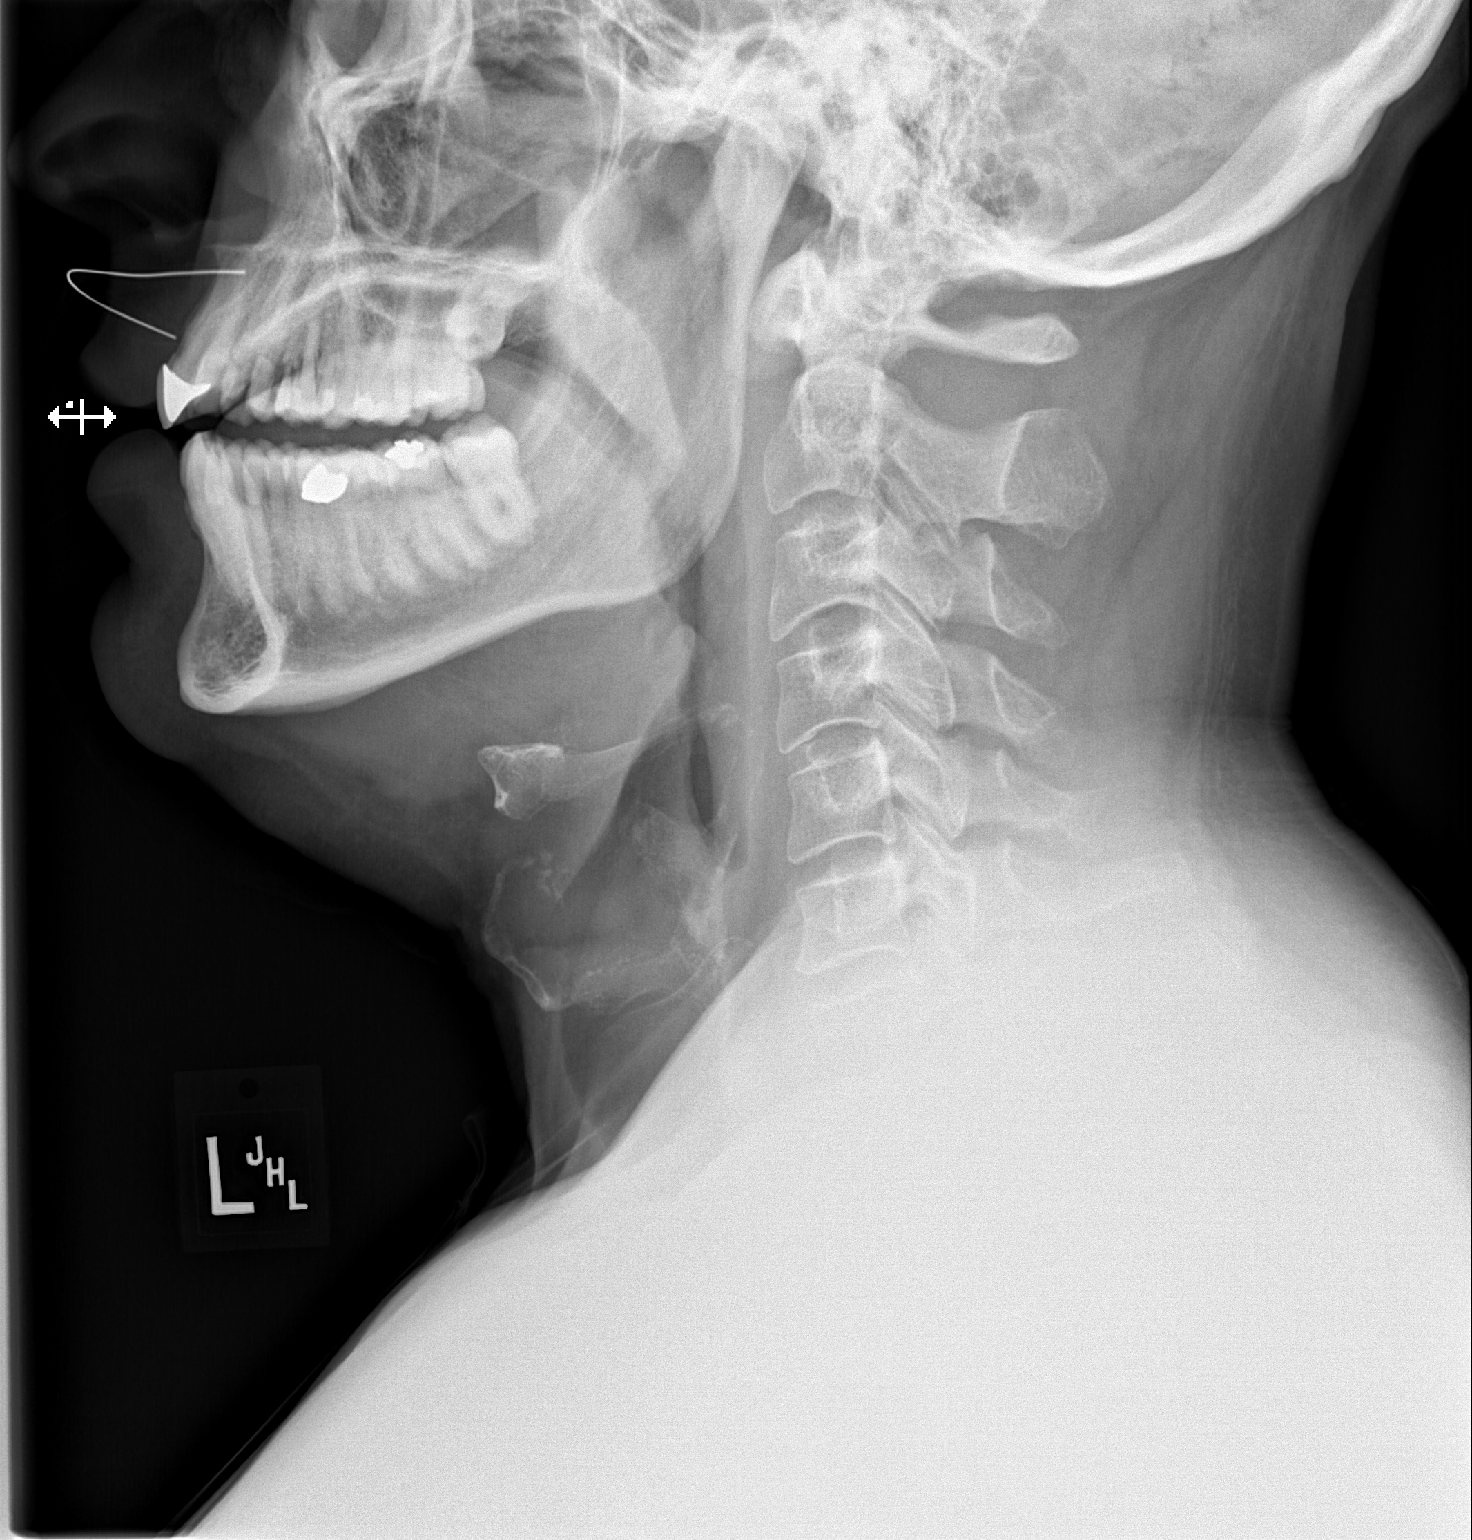

[w soft tissue neck ap]
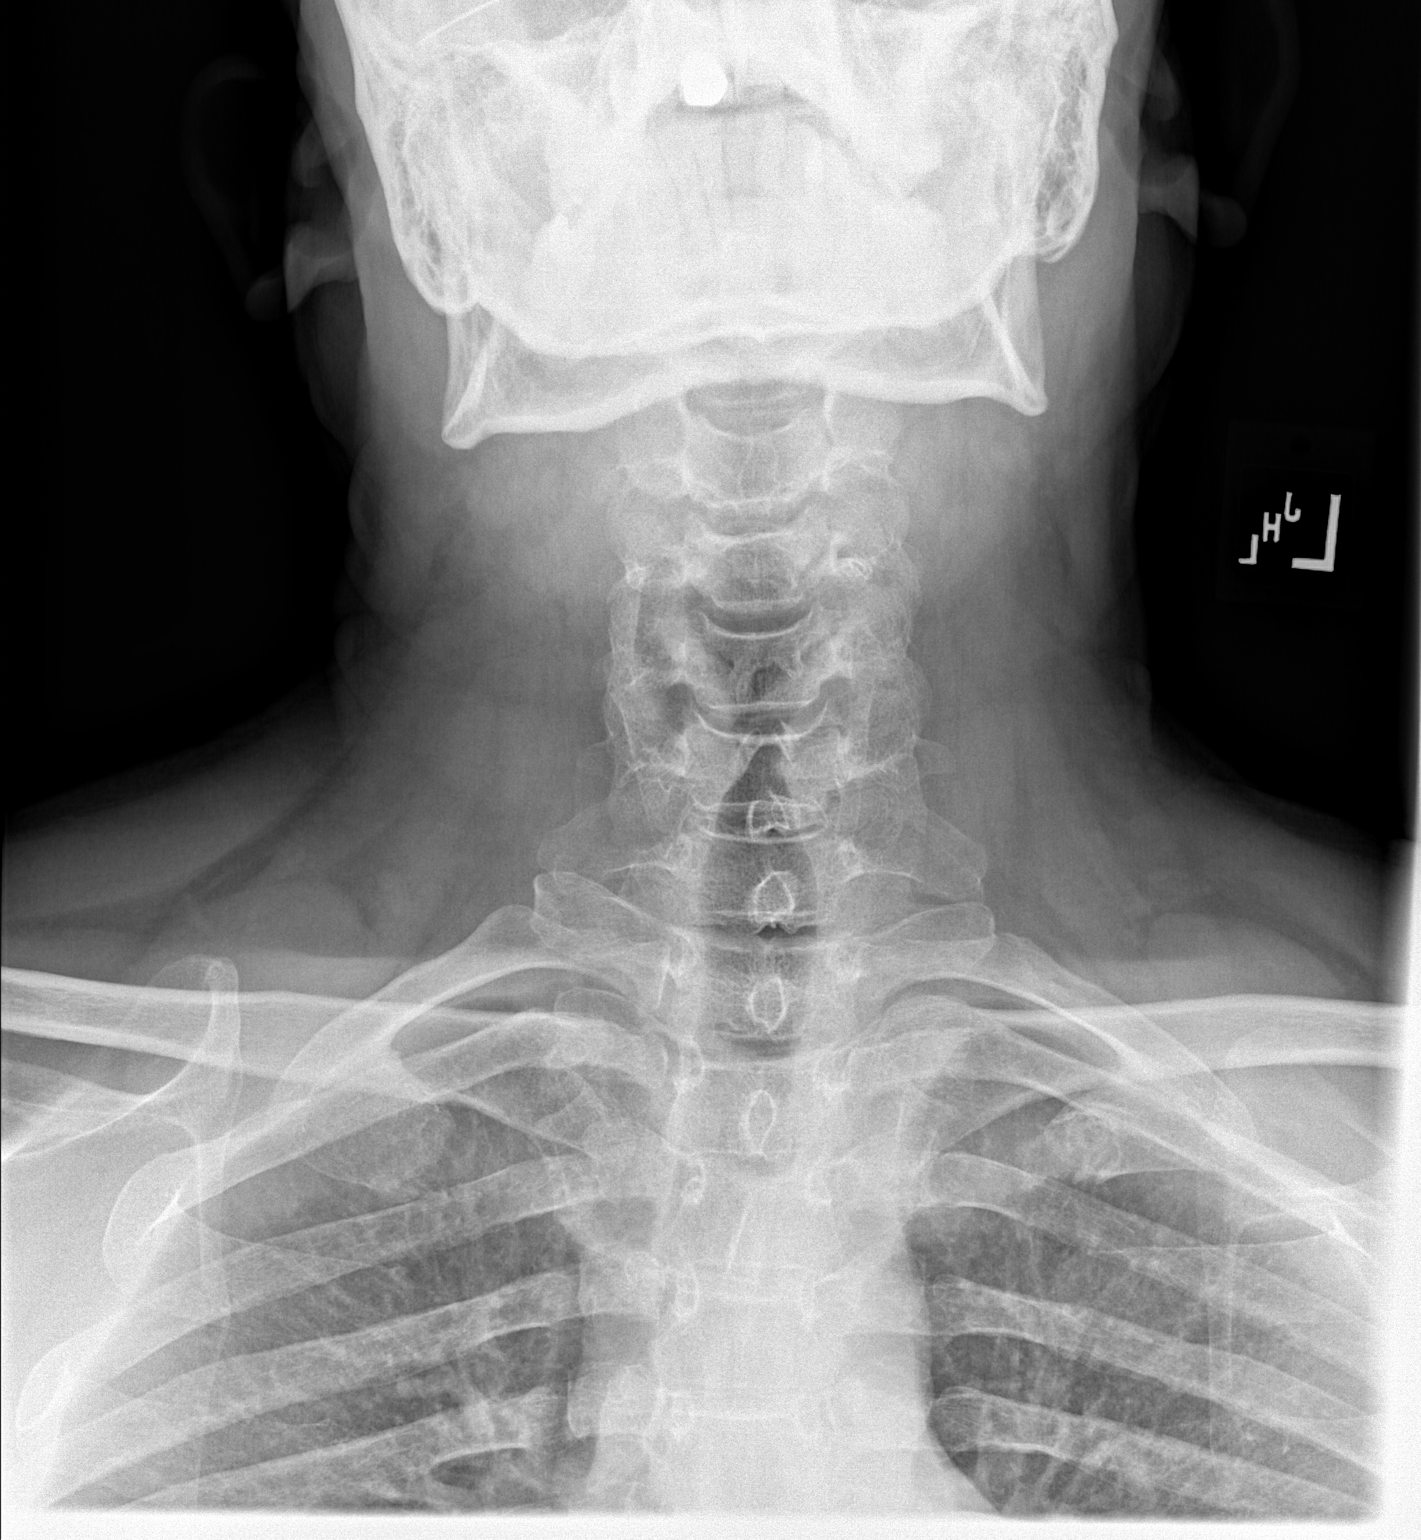

[2 of 2 positions shown; findings below may reference images not displayed]

FINDINGS: There is no evidence of retropharyngeal soft tissue swelling or
epiglottic enlargement. The cervical airway is unremarkable and no
radio-opaque foreign body identified.
IMPRESSION: No definite radiopaque foreign body.
